# Patient Record
Sex: Male | Born: 1965 | Race: Black or African American | Hispanic: No | Marital: Married | Smoking: Former smoker
Health system: Southern US, Community
[De-identification: ages and names within clinical notes are randomized; demographics above are authoritative.]

## PROBLEM LIST (undated history)

## (undated) DIAGNOSIS — E119 Type 2 diabetes mellitus without complications: Secondary | ICD-10-CM

## (undated) DIAGNOSIS — I472 Ventricular tachycardia, unspecified: Secondary | ICD-10-CM

## (undated) DIAGNOSIS — I5032 Chronic diastolic (congestive) heart failure: Secondary | ICD-10-CM

## (undated) DIAGNOSIS — E785 Hyperlipidemia, unspecified: Secondary | ICD-10-CM

## (undated) DIAGNOSIS — I428 Other cardiomyopathies: Secondary | ICD-10-CM

## (undated) DIAGNOSIS — G4730 Sleep apnea, unspecified: Secondary | ICD-10-CM

## (undated) DIAGNOSIS — I10 Essential (primary) hypertension: Secondary | ICD-10-CM

## (undated) DIAGNOSIS — K219 Gastro-esophageal reflux disease without esophagitis: Secondary | ICD-10-CM

## (undated) DIAGNOSIS — M1990 Unspecified osteoarthritis, unspecified site: Secondary | ICD-10-CM

## (undated) HISTORY — PX: OTHER SURGICAL HISTORY: SHX169

## (undated) HISTORY — DX: Other cardiomyopathies: I42.8

## (undated) HISTORY — DX: Ventricular tachycardia, unspecified: I47.20

## (undated) HISTORY — DX: Ventricular tachycardia: I47.2

## (undated) HISTORY — PX: CARDIAC CATHETERIZATION: SHX172

## (undated) HISTORY — PX: INSERT / REPLACE / REMOVE PACEMAKER: SUR710

## (undated) HISTORY — DX: Chronic diastolic (congestive) heart failure: I50.32

---

## 1999-01-28 ENCOUNTER — Emergency Department (HOSPITAL_COMMUNITY): Admission: EM | Admit: 1999-01-28 | Discharge: 1999-01-28 | Payer: Self-pay | Admitting: *Deleted

## 2000-05-29 ENCOUNTER — Emergency Department (HOSPITAL_COMMUNITY): Admission: EM | Admit: 2000-05-29 | Discharge: 2000-05-29 | Payer: Self-pay | Admitting: Internal Medicine

## 2000-05-29 ENCOUNTER — Encounter: Payer: Self-pay | Admitting: Internal Medicine

## 2001-06-27 ENCOUNTER — Encounter: Payer: Self-pay | Admitting: Internal Medicine

## 2001-06-27 ENCOUNTER — Ambulatory Visit (HOSPITAL_COMMUNITY): Admission: RE | Admit: 2001-06-27 | Discharge: 2001-06-27 | Payer: Self-pay | Admitting: Internal Medicine

## 2001-10-11 ENCOUNTER — Inpatient Hospital Stay (HOSPITAL_COMMUNITY): Admission: EM | Admit: 2001-10-11 | Discharge: 2001-10-14 | Payer: Self-pay | Admitting: Emergency Medicine

## 2001-10-11 ENCOUNTER — Encounter: Payer: Self-pay | Admitting: Cardiovascular Disease

## 2001-10-11 ENCOUNTER — Encounter: Payer: Self-pay | Admitting: Emergency Medicine

## 2003-12-25 ENCOUNTER — Emergency Department (HOSPITAL_COMMUNITY): Admission: EM | Admit: 2003-12-25 | Discharge: 2003-12-25 | Payer: Self-pay | Admitting: Emergency Medicine

## 2004-08-11 ENCOUNTER — Encounter: Admission: RE | Admit: 2004-08-11 | Discharge: 2004-08-11 | Payer: Self-pay | Admitting: Internal Medicine

## 2004-08-29 ENCOUNTER — Inpatient Hospital Stay (HOSPITAL_COMMUNITY): Admission: EM | Admit: 2004-08-29 | Discharge: 2004-08-30 | Payer: Self-pay | Admitting: Emergency Medicine

## 2005-08-02 ENCOUNTER — Emergency Department (HOSPITAL_COMMUNITY): Admission: EM | Admit: 2005-08-02 | Discharge: 2005-08-02 | Payer: Self-pay | Admitting: Emergency Medicine

## 2006-05-18 ENCOUNTER — Emergency Department (HOSPITAL_COMMUNITY): Admission: EM | Admit: 2006-05-18 | Discharge: 2006-05-18 | Payer: Self-pay | Admitting: Emergency Medicine

## 2008-07-12 ENCOUNTER — Inpatient Hospital Stay (HOSPITAL_COMMUNITY): Admission: EM | Admit: 2008-07-12 | Discharge: 2008-07-13 | Payer: Self-pay | Admitting: Emergency Medicine

## 2009-04-04 ENCOUNTER — Emergency Department (HOSPITAL_COMMUNITY): Admission: EM | Admit: 2009-04-04 | Discharge: 2009-04-04 | Payer: Self-pay | Admitting: Emergency Medicine

## 2009-06-08 IMAGING — CT CT HEAD W/O CM
1 of 2 series · 15 of 30 positions shown, 19 images · non-contrast
Comparison: 08/11/2004

CLINICAL DATA: Right frontal headache.  History of hypertension.

CT HEAD WITHOUT CONTRAST
TECHNIQUE: Contiguous axial images were obtained from the base of
the skull through the vertex without contrast.

[Series 2: headseq 4.8 h45s · axial · 0.43mm/px · z∈[-119,+7]mm · 15 of 30 slices shown, 19 images]
[im 2/30  brain]
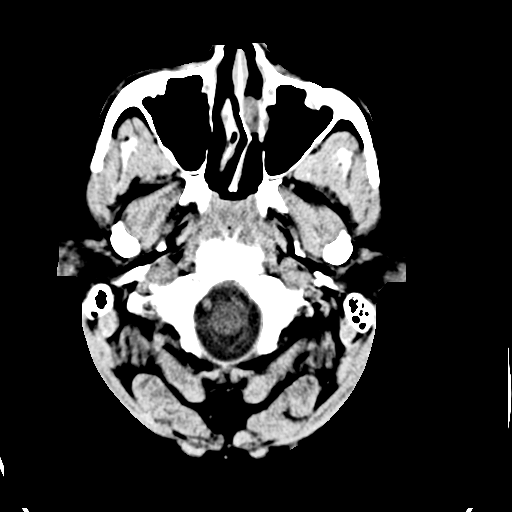
[im 2/30  bone]
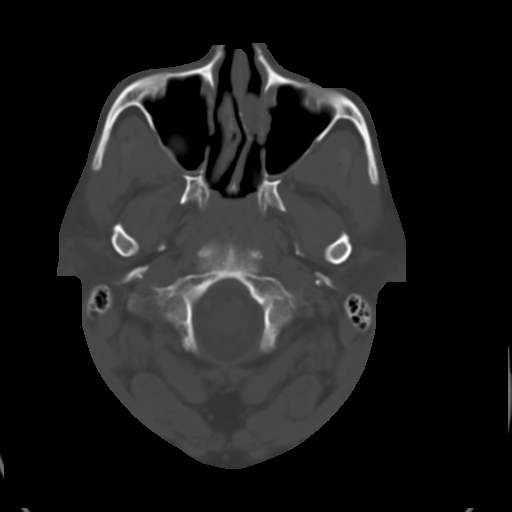
[im 4/30  brain]
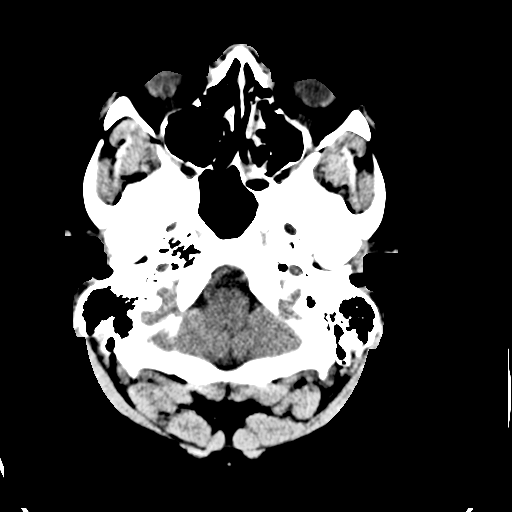
[im 5/30  brain]
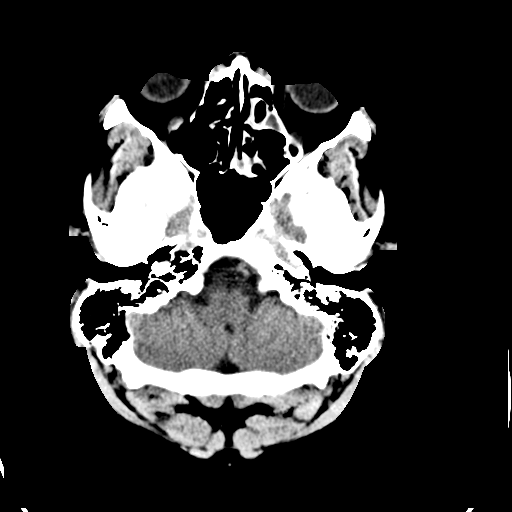
[im 8/30  brain]
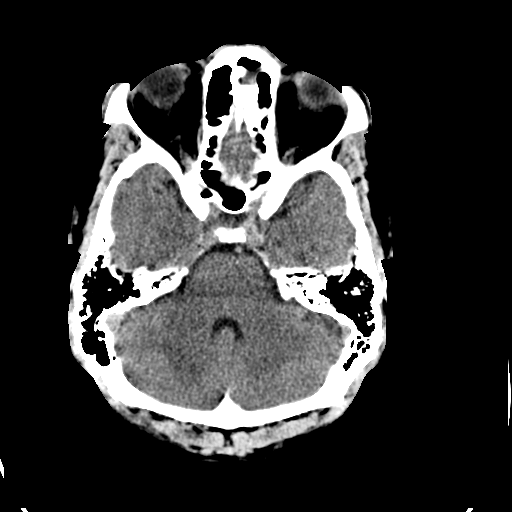
[im 9/30  brain]
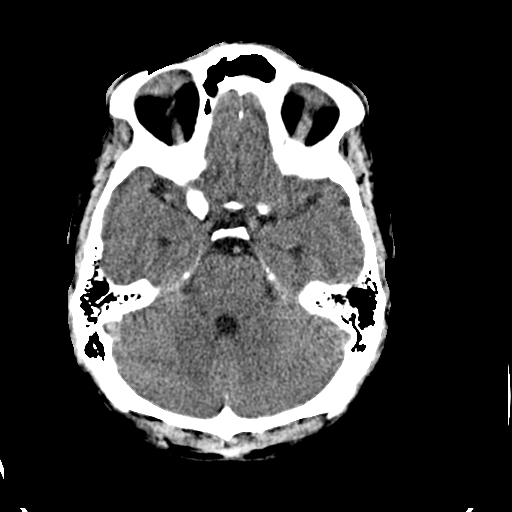
[im 9/30  bone]
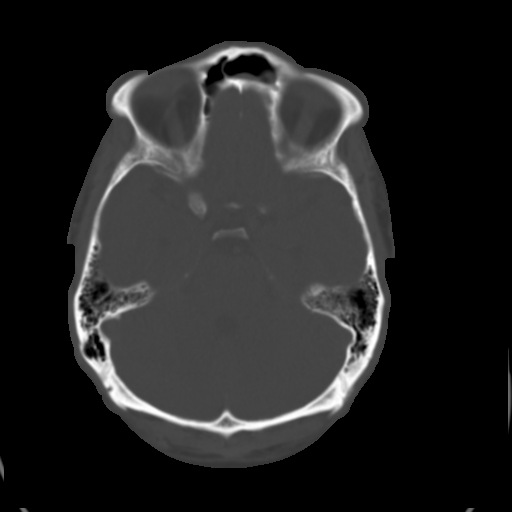
[im 11/30  brain]
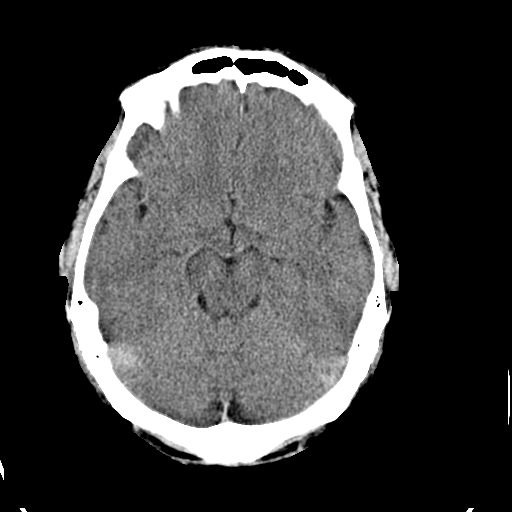
[im 13/30  brain]
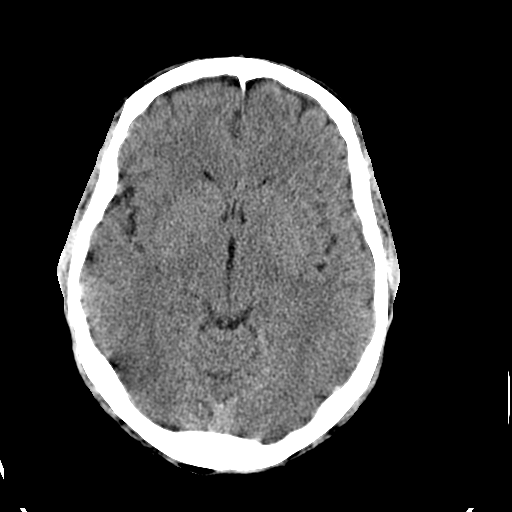
[im 15/30  brain]
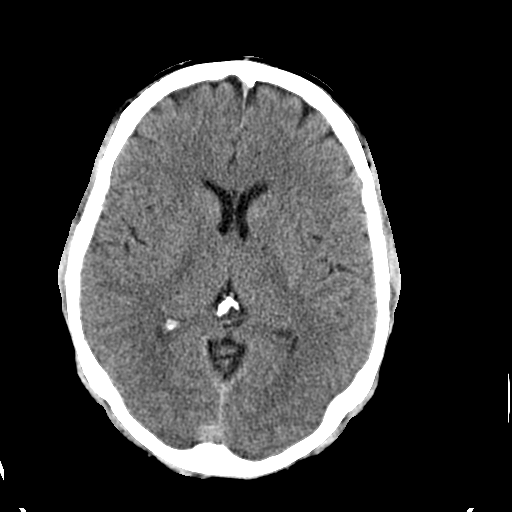
[im 17/30  brain]
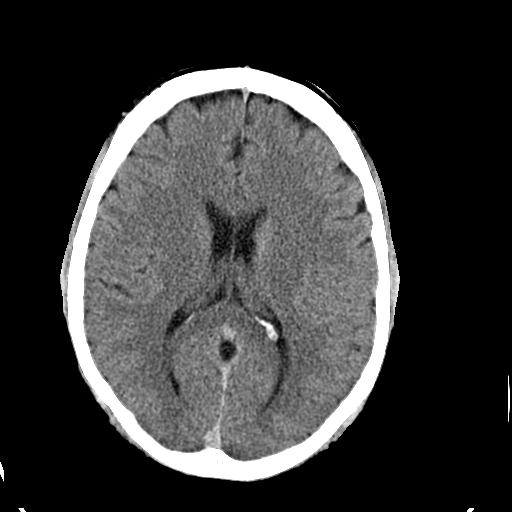
[im 17/30  bone]
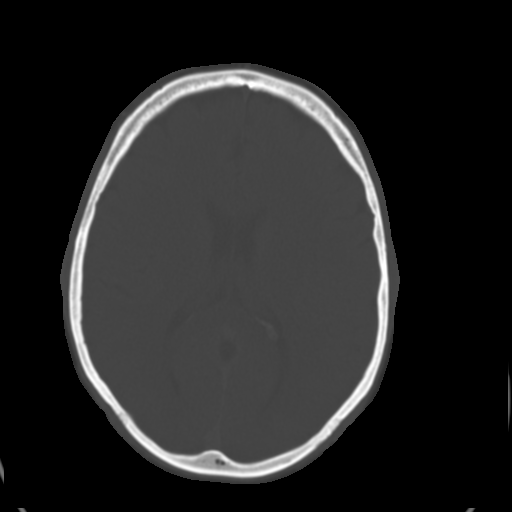
[im 19/30  brain]
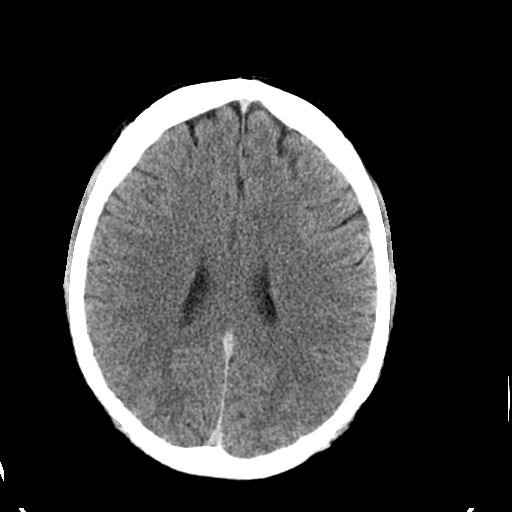
[im 21/30  brain]
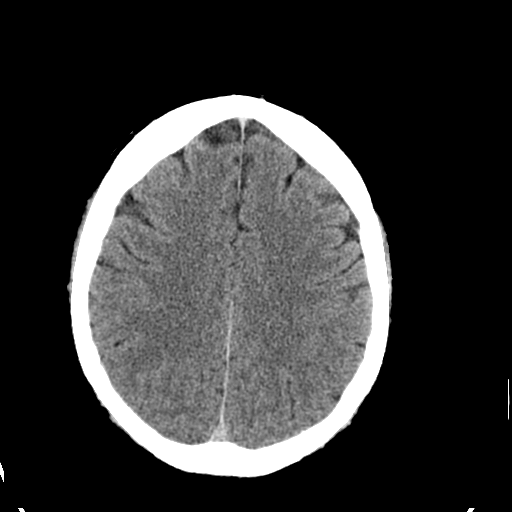
[im 22/30  brain]
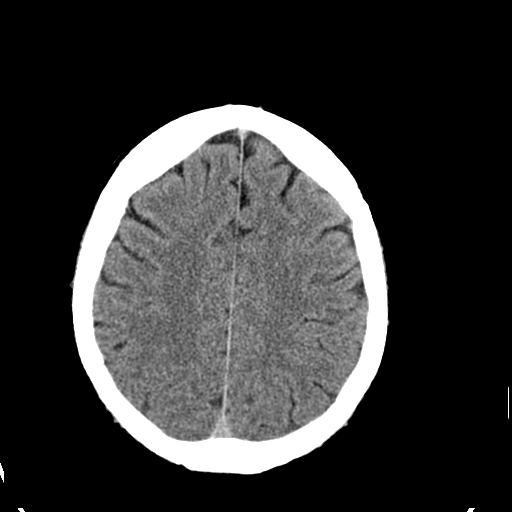
[im 25/30  brain]
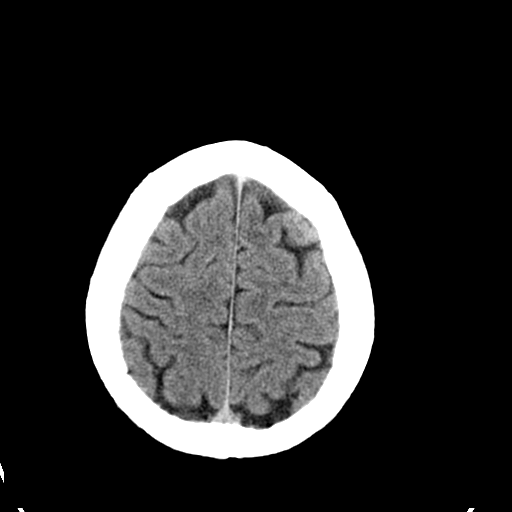
[im 25/30  bone]
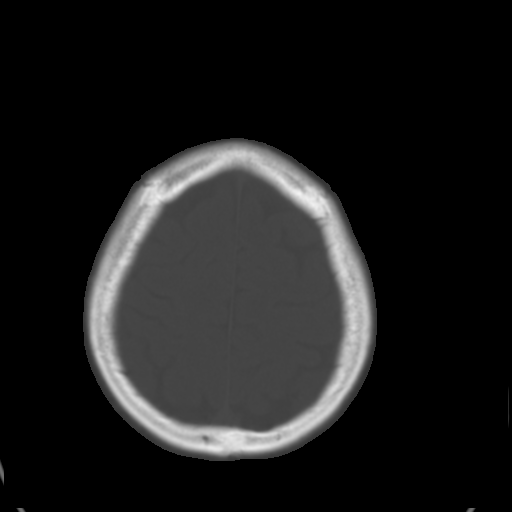
[im 26/30  brain]
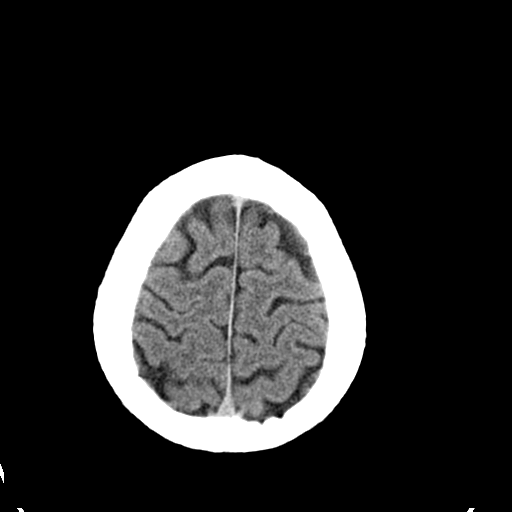
[im 28/30  brain]
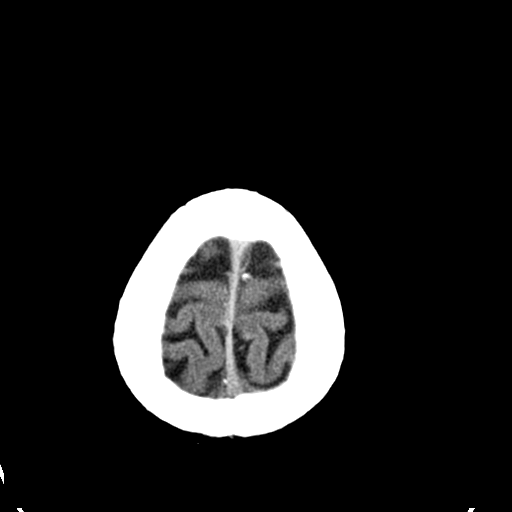

[15 of 30 positions shown; findings below may reference images not displayed]

FINDINGS: The brain stem, cerebellum, cerebral peduncles, thalami,
basal ganglia, basilar cisterns, and ventricular system appear
unremarkable.  No intracranial hemorrhage, mass lesion, or acute
CVA is identified.

There is polypoid mucoperiosteal thickening in the right maxillary
sinus.  Mild chronic left maxillary sinusitis is noted.  Mild
mucosal thickening in the nasal cavity is noted and there may be a
left nasal polyp.  There is opacification of several ethmoid air
cells on the left side compatible with chronic ethmoid sinusitis.
There is minimal left sphenoid chronic sinusitis.  The mastoid air
cells appear unremarkable.  There is mild left frontal chronic
sinusitis.
IMPRESSION: 1.  Chronic paranasal sinusitis.  This is relatively similar to the
prior exam.  Possible left nasal polyp.
2.  No acute intracranial findings.

## 2009-06-26 ENCOUNTER — Ambulatory Visit (HOSPITAL_COMMUNITY): Admission: RE | Admit: 2009-06-26 | Discharge: 2009-06-26 | Payer: Self-pay | Admitting: Plastic Surgery

## 2009-08-15 ENCOUNTER — Ambulatory Visit (HOSPITAL_COMMUNITY): Admission: RE | Admit: 2009-08-15 | Discharge: 2009-08-15 | Payer: Self-pay | Admitting: Plastic Surgery

## 2009-10-31 ENCOUNTER — Ambulatory Visit (HOSPITAL_COMMUNITY): Admission: RE | Admit: 2009-10-31 | Discharge: 2009-10-31 | Payer: Self-pay | Admitting: Plastic Surgery

## 2009-12-03 ENCOUNTER — Encounter: Admission: RE | Admit: 2009-12-03 | Discharge: 2009-12-25 | Payer: Self-pay | Admitting: Plastic Surgery

## 2009-12-24 ENCOUNTER — Encounter: Admission: RE | Admit: 2009-12-24 | Discharge: 2010-01-02 | Payer: Self-pay | Admitting: Plastic Surgery

## 2010-03-29 ENCOUNTER — Emergency Department (HOSPITAL_COMMUNITY): Admission: EM | Admit: 2010-03-29 | Discharge: 2010-03-29 | Payer: Self-pay | Admitting: Emergency Medicine

## 2010-07-31 DIAGNOSIS — K219 Gastro-esophageal reflux disease without esophagitis: Secondary | ICD-10-CM | POA: Insufficient documentation

## 2010-08-13 ENCOUNTER — Encounter: Admission: RE | Admit: 2010-08-13 | Discharge: 2010-08-13 | Payer: Self-pay | Admitting: Cardiology

## 2010-08-18 ENCOUNTER — Ambulatory Visit (HOSPITAL_COMMUNITY): Admission: RE | Admit: 2010-08-18 | Discharge: 2010-08-18 | Payer: Self-pay | Admitting: Cardiovascular Disease

## 2010-09-03 DIAGNOSIS — I509 Heart failure, unspecified: Secondary | ICD-10-CM | POA: Insufficient documentation

## 2010-09-08 ENCOUNTER — Ambulatory Visit (HOSPITAL_COMMUNITY): Admission: RE | Admit: 2010-09-08 | Discharge: 2010-09-08 | Payer: Self-pay | Admitting: Cardiovascular Disease

## 2011-01-18 ENCOUNTER — Encounter: Payer: Self-pay | Admitting: Cardiology

## 2011-03-12 LAB — PROTIME-INR: INR: 0.96 (ref 0.00–1.49)

## 2011-03-12 LAB — CBC
HCT: 38.9 % — ABNORMAL LOW (ref 39.0–52.0)
Hemoglobin: 13.7 g/dL (ref 13.0–17.0)
MCHC: 35.2 g/dL (ref 30.0–36.0)
MCV: 88.2 fL (ref 78.0–100.0)
Platelets: 178 10*3/uL (ref 150–400)
RBC: 4.41 MIL/uL (ref 4.22–5.81)
WBC: 9.4 10*3/uL (ref 4.0–10.5)

## 2011-03-12 LAB — BASIC METABOLIC PANEL
Calcium: 8.6 mg/dL (ref 8.4–10.5)
Chloride: 107 mEq/L (ref 96–112)
Creatinine, Ser: 1.24 mg/dL (ref 0.4–1.5)
GFR calc non Af Amer: >60 mL/min (ref >60)
Glucose, Bld: 104 mg/dL — ABNORMAL HIGH (ref 70–99)
Potassium: 3.8 mEq/L (ref 3.5–5.1)
Sodium: 138 mEq/L (ref 135–145)

## 2011-03-12 LAB — SURGICAL PCR SCREEN: Staphylococcus aureus: NEGATIVE

## 2011-04-01 LAB — BASIC METABOLIC PANEL
Calcium: 8.9 mg/dL (ref 8.4–10.5)
Creatinine, Ser: 1.32 mg/dL (ref 0.4–1.5)
Sodium: 139 mEq/L (ref 135–145)

## 2011-04-01 LAB — CBC
Hemoglobin: 15.6 g/dL (ref 13.0–17.0)
MCHC: 34.5 g/dL (ref 30.0–36.0)
Platelets: 200 10*3/uL (ref 150–400)
RBC: 4.89 MIL/uL (ref 4.22–5.81)
WBC: 9.3 10*3/uL (ref 4.0–10.5)

## 2011-05-12 NOTE — Discharge Summary (Signed)
[name], [name]                   ACCOUNT NO.:  [id number]   MEDICAL RECORD NO.:  [id number]          PATIENT TYPE:  INP   LOCATION:  1424                         FACILITY:  [facility]   PHYSICIAN:  [name], M.D.     DATE OF BIRTH:  12/23/1966   DATE OF ADMISSION:  07/12/2008  DATE OF DISCHARGE:  07/13/2008                               DISCHARGE SUMMARY   DISCHARGE DIAGNOSES:  1. Right-sided headache, probably from sinusitis.  2. Known nonischemic cardiomyopathy.  3. Hypertension with suboptimal control.  4. History of normal coronaries, September 2005.   HOSPITAL COURSE:  [name] is a 45 year old African American male  followed by Dr. Tresa Endo with a history of nonischemic cardiomyopathy.  He  had a catheterization September 2005.  He had a remote history of  substance abuse, but his drug screen was negative.  He presented with 3  days of intermittent right-sided headache as well as right facial  headache.  He had some vague, sharp left lateral chest pain as well.  He  denied any shortness of breath.  He was admitted from the ER and started  on IV heparin.  His CK-MBs were slightly elevated, but his troponins  were all negative.  When examined on the morning of the 17th, he  continued to complain of mainly intermittent right-sided throbbing  headache.  He was sent for a CT scan which revealed chronic sinusitis.  Dr. Elsie Lincoln saw the patient on the 17th and feels he can be discharged.  We have arranged for him to have a followup with an ENT physician. and  he will get a followup exercise Myoview in our office as an outpatient.  The patient does admit that he has had previous sinusitis bouts.  He  really has no primary physician.   DISCHARGE MEDICATIONS:  1. Augmentin 500/125 b.i.d. x7 days.  2. Flonase 2 puffs each nostril daily.  3. Norvasc 5 mg daily.  4. Aspirin 81 mg daily.  5. Coreg 25 mg b.i.d.  6. Lanoxin 0.125 mg daily.  7. Lasix 40 mg daily.  8. Diovan 160 mg daily.  9. Prilosec p.r.n.   LABORATORY:  White count 11.8, hemoglobin 15.3, hematocrit 44.5,  platelets 203, D-dimer less than 2.22.  Sodium 143, potassium 3.6, BUN  10, creatinine 1.16.  Liver functions were normal.  CKs peaked at 203  with 14 MBs.  His troponins were negative x3.  BNP was 75.8.  Cholesterol showed a total cholesterol 215, HDL 32, LDL 50.  TSH is  2.49.  Digoxin level less than 0.2.  Drug screen was negative.  CT of  the head showed chronic paranasal sinusitis, no acute intracranial  findings.  Chest x-ray on the 16th shows no active disease.  His EKG  showed sinus rhythm, sinus bradycardia.  At times, his heart rate would  drop down to 40 at night, although he was asymptomatic with this.   PLAN:  The patient is discharged in stable condition.  We will go ahead  and go up on his Diovan, although it may be best to cut  back on his  Coreg to 12.5 b.i.d.  We will set him up for an outpatient Myoview next  week and set up to see Dr. Rodolph Bong for an ENT evaluation.      Abelino Derrick, P.A.    ______________________________  [name], M.D.    Lenard Lance  D:  07/13/2008  T:  07/13/2008  Job:  [id number]   cc:   Rodolph Bong, M.D.  Fax: [phone]

## 2011-05-15 NOTE — Discharge Summary (Signed)
[name], [name]                               ACCOUNT NO.:  [id number]   MEDICAL RECORD NO.:  [id number]                   PATIENT TYPE:  INP   LOCATION:  3742                                 FACILITY:  [facility]   PHYSICIAN:  Darlin Priestly, M.D.             DATE OF BIRTH:  11/28/1966   DATE OF ADMISSION:  08/29/2004  DATE OF DISCHARGE:  08/29/2004                                 DISCHARGE SUMMARY   DISCHARGE DIAGNOSES:  1.  Chest pain, ruled out for myocardial infarction by negative EKG changes,      no acute cardiac point of care markers, and by negative catheterization.  2.  Hypertension.  3.  Left ventricular dysfunction.  4.  Ethanol abuse.  5.  Cocaine abuse.  6.  Hypertension.  7.  Hypercholesterolemia.  8.  History of pneumonia.  9.  Cardiomyopathy with ejection fraction in 2002 by echocardiogram 20%.   DISCHARGE MEDICATIONS:  1.  Lanoxin 0.25 mg daily.  2.  Lasix 40 mg daily.  3.  Nexium 40 mg daily.  4.  Pravachol 20 mg daily.  5.  Coreg 12.5 mg b.i.d.  6.  Avapro 150 mg daily.  7.  Inspra 25 mg daily.  8.  Aspirin 81 mg daily.   HISTORY OF PRESENT ILLNESS/HOSPITAL COURSE:  This is a 45 year old, African-  American gentleman who experienced onset of substernal chest pain and left  arm pain and numbness, and was delivered to the emergency room by EMS.  En  route, EMS gave the patient sublingual nitroglycerin with relative relief of  pain.  At the time of arrival to the emergency room and assessment by  physician, he still had chest pain, 3 on a scale from 0-10.   The patient was taken to the cardiac catheterization laboratory by Dr.  Pearletha Furl. Alanda Amass.  He performed a catheterization which showed  essentially clear coronaries, just 40% small lesion in the LAD.  His EF was  25 to 30%.   His CK was elevated likely secondary to ethanol abuse, and a decision was  made for catheterization and to proceed to keep him for bed rest and then  discharge if the patient  was stable after ambulation, and no complications  with groin, and follow up with Dr. Tresa Endo in the office.   DISCHARGE INSTRUCTIONS:  1.  No driving, no lifting greater than 5 pounds for three days.  2.  No strenuous activity.   DISCHARGE DIET:  A low-fat, low-cholesterol diet.   DISCHARGE FOLLOWUP:  Appointment scheduled with Dr. Tresa Endo on September 14,  at 3:30 p.m.   LABORATORY DATA:  His point of care markers, first set showed CK-MB 3.4,  myoglobin 398, and troponin 0.05.  Second set CK-MB 3.2, myoglobin 231, and  troponin less than 0.05.  Third set CK-MB 3.1, myoglobin 224 and troponin  less than 0.05.  His hemoglobin was 15.9,  hematocrit 36.3, potassium 3.2,  sodium 136, BUN 11, creatinine 1.4.  We gave him extra potassium prior to  catheterization, and post cath.  We will check potassium level as an  outpatient.      Raymon Mutton, P.A.                    Darlin Priestly, M.D.    MK/MEDQ  D:  08/29/2004  T:  08/30/2004  Job:  [id number]   cc:   [facility]
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
## 2011-05-15 NOTE — Discharge Summary (Signed)
[facility]  Patient:    [name], [name] Visit Number: [id number] MRN: [id number]          Service Type: MED Location: 3W [id number] [id number] Attending Physician:  Wilson Singer Dictated by:   Lilly Cove, M.D. Admit Date:  10/11/2001 Discharge Date: 10/14/2001                             Discharge Summary  MEDICATIONS ON DISCHARGE: 1. Aspirin 325 mg q.d. 2. Pravachol 20 mg q.d. 3. Protonix 40 mg q.d. 4. Digoxin 0.25 mg q.d. 5. Coreg 3.125 mg b.i.d. 6. Altace 5 mg b.i.d. 7. Tequin 400 mg q.d. for five days.  CONDITION ON DISCHARGE:  Stable.  FINAL DISCHARGE DIAGNOSES: 1. Pneumonia. 2. Idiopathic cardiomyopathy.  HISTORY OF PRESENT ILLNESS:  This 45 year old man was admitted with chest pain and cough.  Please see initial history and physical examination by Dr. Velna Hatchet.  HOSPITAL COURSE:  Patient was admitted to the telemetry floor and was seen by cardiology who also did echocardiogram.  Echocardiogram showed ejection fraction of less than 20%.  A CT scan of the chest was done to rule out pulmonary embolism and this was negative.  There was a right pleural rub heard and this could be secondary to a pneumonia.  The CT scan did show evidence of pneumonia rather than a PE.  Heparin was discontinued which had been initially started and he was continued with antibiotics.  He improved somewhat with his symptoms of cough and shortness of breath and medications were adjusted to include an ACE inhibitor and also Coreg.  He had a couple of episodes of nonsustained ventricular tachycardia.  He was in stable condition to be discharged home, but he will follow up with cardiology in approximately two weeks time and follow up with myself in one week.  He will finish his course of antibiotics over the next five days. Dictated by:   Lilly Cove, M.D. Attending Physician:  Wilson Singer DD:  10/26/01 TD:  10/27/01 Job: [id number]

## 2011-09-14 DIAGNOSIS — G4733 Obstructive sleep apnea (adult) (pediatric): Secondary | ICD-10-CM | POA: Insufficient documentation

## 2011-09-25 LAB — CARDIAC PANEL(CRET KIN+CKTOT+MB+TROPI)
Relative Index: 5.9 — ABNORMAL HIGH
Troponin I: 0.02
Troponin I: 0.02

## 2011-09-25 LAB — RAPID URINE DRUG SCREEN, HOSP PERFORMED
Amphetamines: NOT DETECTED
Barbiturates: NOT DETECTED
Benzodiazepines: NOT DETECTED
Cocaine: NOT DETECTED
Opiates: NOT DETECTED
Tetrahydrocannabinol: NOT DETECTED

## 2011-09-25 LAB — POCT CARDIAC MARKERS
CKMB, poc: 10.8
Myoglobin, poc: 197
Operator id: 264421
Troponin i, poc: <0.05

## 2011-09-25 LAB — TROPONIN I: Troponin I: 0.03

## 2011-09-25 LAB — CK TOTAL AND CKMB (NOT AT ARMC)
CK, MB: 14.4 — ABNORMAL HIGH
Relative Index: 7.1 — ABNORMAL HIGH
Total CK: 203

## 2011-09-25 LAB — COMPREHENSIVE METABOLIC PANEL
ALT: 36
AST: 34
Albumin: 3.6
Chloride: 108
Creatinine, Ser: 1.16
GFR calc Af Amer: >60
Sodium: 143
Total Bilirubin: 1

## 2011-09-25 LAB — POCT I-STAT, CHEM 8
BUN: 14
Calcium, Ion: 1.07 — ABNORMAL LOW
Chloride: 107
Creatinine, Ser: 1.4
Glucose, Bld: 81
HCT: 45
Hemoglobin: 15.3
Potassium: 3.4 — ABNORMAL LOW
Sodium: 141
TCO2: 23

## 2011-09-25 LAB — CBC
MCHC: 34.5
MCV: 90.1
Platelets: 203
RDW: 13.4
WBC: 11.8 — ABNORMAL HIGH

## 2011-09-25 LAB — PROTIME-INR
INR: 0.9
Prothrombin Time: 12.7

## 2011-09-25 LAB — DIFFERENTIAL
Basophils Absolute: 0.1
Eosinophils Absolute: 0.2
Lymphs Abs: 3.7
Monocytes Absolute: 1
Neutrophils Relative %: 61

## 2011-09-25 LAB — LIPID PANEL
Cholesterol: 215 — ABNORMAL HIGH
HDL: 32 — ABNORMAL LOW
Total CHOL/HDL Ratio: 6.7
Triglycerides: 165 — ABNORMAL HIGH

## 2011-09-25 LAB — APTT: aPTT: 31

## 2011-09-25 LAB — TSH: TSH: 2.495

## 2011-09-25 LAB — DIGOXIN LEVEL: Digoxin Level: <0.2 — ABNORMAL LOW

## 2011-10-21 DIAGNOSIS — Z800 Family history of malignant neoplasm of digestive organs: Secondary | ICD-10-CM | POA: Insufficient documentation

## 2011-11-12 DIAGNOSIS — J329 Chronic sinusitis, unspecified: Secondary | ICD-10-CM | POA: Insufficient documentation

## 2011-11-24 DIAGNOSIS — I10 Essential (primary) hypertension: Secondary | ICD-10-CM

## 2011-11-24 HISTORY — DX: Essential (primary) hypertension: I10

## 2012-10-18 ENCOUNTER — Other Ambulatory Visit (HOSPITAL_COMMUNITY): Payer: Self-pay | Admitting: Orthopaedic Surgery

## 2012-10-19 ENCOUNTER — Encounter (HOSPITAL_COMMUNITY): Payer: Self-pay | Admitting: *Deleted

## 2012-10-20 ENCOUNTER — Encounter (HOSPITAL_COMMUNITY): Payer: Self-pay | Admitting: Pharmacy Technician

## 2012-10-20 NOTE — Progress Notes (Signed)
Refaxed to [facility] request for Cardiac Perioperative Orders to be completed.  This is second request.

## 2012-10-20 NOTE — Progress Notes (Signed)
Medications not listed under home meds as of yet.  Pharmacy unable to contact patient.  Nurse called patient and spoke with patient again over phone and received name of pharmacy- [facility]- corner of [address] and [address].  Patient stated he was not going to take any meds tomorrow( 10/25 ) prior to surgery.  Nurse instructed patient to take Carvedilol ( coreg) in am on 10/25 with sip of water.

## 2012-10-21 ENCOUNTER — Encounter (HOSPITAL_COMMUNITY): Payer: Self-pay | Admitting: Anesthesiology

## 2012-10-21 ENCOUNTER — Encounter (HOSPITAL_COMMUNITY): Payer: Self-pay | Admitting: *Deleted

## 2012-10-21 ENCOUNTER — Ambulatory Visit (HOSPITAL_COMMUNITY)
Admission: RE | Admit: 2012-10-21 | Discharge: 2012-10-21 | Disposition: A | Discharge status: Home / Self Care | Payer: Medicare Other | Source: Ambulatory Visit | Attending: Orthopaedic Surgery | Admitting: Orthopaedic Surgery

## 2012-10-21 ENCOUNTER — Ambulatory Visit (HOSPITAL_COMMUNITY): Payer: Medicare Other

## 2012-10-21 ENCOUNTER — Ambulatory Visit (HOSPITAL_COMMUNITY): Payer: Medicare Other | Admitting: Anesthesiology

## 2012-10-21 ENCOUNTER — Encounter (HOSPITAL_COMMUNITY)
Admission: RE | Disposition: A | Discharge status: Home / Self Care | Payer: Self-pay | Source: Ambulatory Visit | Attending: Orthopaedic Surgery

## 2012-10-21 DIAGNOSIS — E119 Type 2 diabetes mellitus without complications: Secondary | ICD-10-CM | POA: Insufficient documentation

## 2012-10-21 DIAGNOSIS — R2232 Localized swelling, mass and lump, left upper limb: Secondary | ICD-10-CM

## 2012-10-21 DIAGNOSIS — Z79899 Other long term (current) drug therapy: Secondary | ICD-10-CM | POA: Insufficient documentation

## 2012-10-21 DIAGNOSIS — I10 Essential (primary) hypertension: Secondary | ICD-10-CM | POA: Insufficient documentation

## 2012-10-21 DIAGNOSIS — K219 Gastro-esophageal reflux disease without esophagitis: Secondary | ICD-10-CM | POA: Insufficient documentation

## 2012-10-21 DIAGNOSIS — Z7982 Long term (current) use of aspirin: Secondary | ICD-10-CM | POA: Insufficient documentation

## 2012-10-21 DIAGNOSIS — G4730 Sleep apnea, unspecified: Secondary | ICD-10-CM | POA: Insufficient documentation

## 2012-10-21 DIAGNOSIS — R229 Localized swelling, mass and lump, unspecified: Secondary | ICD-10-CM | POA: Insufficient documentation

## 2012-10-21 DIAGNOSIS — Z950 Presence of cardiac pacemaker: Secondary | ICD-10-CM | POA: Insufficient documentation

## 2012-10-21 HISTORY — DX: Unspecified osteoarthritis, unspecified site: M19.90

## 2012-10-21 HISTORY — DX: Type 2 diabetes mellitus without complications: E11.9

## 2012-10-21 HISTORY — DX: Essential (primary) hypertension: I10

## 2012-10-21 HISTORY — DX: Sleep apnea, unspecified: G47.30

## 2012-10-21 HISTORY — DX: Gastro-esophageal reflux disease without esophagitis: K21.9

## 2012-10-21 HISTORY — PX: MASS EXCISION: SHX2000

## 2012-10-21 LAB — SURGICAL PCR SCREEN
MRSA, PCR: NEGATIVE
Staphylococcus aureus: NEGATIVE

## 2012-10-21 LAB — GLUCOSE, CAPILLARY
Glucose-Capillary: 105 mg/dL — ABNORMAL HIGH (ref 70–99)
Glucose-Capillary: 90 mg/dL (ref 70–99)

## 2012-10-21 LAB — BASIC METABOLIC PANEL
CO2: 26 mEq/L (ref 19–32)
Calcium: 8.9 mg/dL (ref 8.4–10.5)
GFR calc non Af Amer: >90 mL/min (ref >90)
Potassium: 3.8 mEq/L (ref 3.5–5.1)
Sodium: 140 mEq/L (ref 135–145)

## 2012-10-21 LAB — CBC
MCH: 29.7 pg (ref 26.0–34.0)
Platelets: 188 10*3/uL (ref 150–400)
RBC: 4.34 MIL/uL (ref 4.22–5.81)

## 2012-10-21 SURGERY — EXCISION MASS
Anesthesia: General | Site: Finger | Laterality: Left

## 2012-10-21 MED ORDER — LACTATED RINGERS IV SOLN
INTRAVENOUS | Status: DC | PRN
  Administered 2012-10-21: 15:00:00 via INTRAVENOUS

## 2012-10-21 MED ORDER — FENTANYL CITRATE 0.05 MG/ML IJ SOLN
INTRAMUSCULAR | Status: DC | PRN
  Administered 2012-10-21 (×2): 50 ug via INTRAVENOUS

## 2012-10-21 MED ORDER — PROPOFOL 10 MG/ML IV BOLUS
INTRAVENOUS | Status: DC | PRN
  Administered 2012-10-21: 200 mg via INTRAVENOUS
  Administered 2012-10-21 (×2): 100 mg via INTRAVENOUS

## 2012-10-21 MED ORDER — LIDOCAINE HCL (CARDIAC) 20 MG/ML IV SOLN
INTRAVENOUS | Status: DC | PRN
  Administered 2012-10-21: 100 mg via INTRAVENOUS

## 2012-10-21 MED ORDER — PROMETHAZINE HCL 25 MG/ML IJ SOLN: 6.2500 mg | INTRAMUSCULAR | Status: DC | PRN

## 2012-10-21 MED ORDER — SUCCINYLCHOLINE CHLORIDE 20 MG/ML IJ SOLN
INTRAMUSCULAR | Status: DC | PRN
  Administered 2012-10-21: 100 mg via INTRAVENOUS

## 2012-10-21 MED ORDER — ROCURONIUM BROMIDE 100 MG/10ML IV SOLN
INTRAVENOUS | Status: DC | PRN
  Administered 2012-10-21: 4 mg via INTRAVENOUS

## 2012-10-21 MED ORDER — BUPIVACAINE HCL (PF) 0.25 % IJ SOLN
INTRAMUSCULAR | Status: DC | PRN
  Administered 2012-10-21: 9 mL

## 2012-10-21 MED ORDER — ACETAMINOPHEN 10 MG/ML IV SOLN
INTRAVENOUS | Status: DC | PRN
  Administered 2012-10-21: 1000 mg via INTRAVENOUS

## 2012-10-21 MED ORDER — LACTATED RINGERS IV SOLN
INTRAVENOUS | Status: DC
  Administered 2012-10-21: 1000 mL via INTRAVENOUS

## 2012-10-21 MED ORDER — CEFAZOLIN SODIUM-DEXTROSE 2-3 GM-% IV SOLR: 2.0000 g | INTRAVENOUS | Status: DC

## 2012-10-21 MED ORDER — CEFAZOLIN SODIUM-DEXTROSE 2-3 GM-% IV SOLR
INTRAVENOUS | Status: DC | PRN
  Administered 2012-10-21: 2 g via INTRAVENOUS

## 2012-10-21 MED ORDER — BUPIVACAINE HCL (PF) 0.25 % IJ SOLN
INTRAMUSCULAR | Status: AC
  Filled 2012-10-21: qty 30

## 2012-10-21 MED ORDER — 0.9 % SODIUM CHLORIDE (POUR BTL) OPTIME
TOPICAL | Status: DC | PRN
  Administered 2012-10-21: 1000 mL

## 2012-10-21 MED ORDER — CEFAZOLIN SODIUM-DEXTROSE 2-3 GM-% IV SOLR
INTRAVENOUS | Status: AC
  Filled 2012-10-21: qty 50

## 2012-10-21 MED ORDER — HYDROMORPHONE HCL PF 1 MG/ML IJ SOLN
INTRAMUSCULAR | Status: AC
  Filled 2012-10-21: qty 1

## 2012-10-21 MED ORDER — ACETAMINOPHEN 10 MG/ML IV SOLN: 1000.0000 mg | Freq: Once | INTRAVENOUS | Status: DC | PRN

## 2012-10-21 MED ORDER — HYDROMORPHONE HCL PF 1 MG/ML IJ SOLN
0.2500 mg | INTRAMUSCULAR | Status: DC | PRN
  Administered 2012-10-21: 0.5 mg via INTRAVENOUS

## 2012-10-21 MED ORDER — HYDROCODONE-ACETAMINOPHEN 5-325 MG PO TABS: 1.0000 | ORAL_TABLET | ORAL | Status: DC | PRN

## 2012-10-21 MED ORDER — MUPIROCIN 2 % EX OINT
TOPICAL_OINTMENT | Freq: Two times a day (BID) | CUTANEOUS | Status: DC
  Administered 2012-10-21: 1 via NASAL
  Filled 2012-10-21: qty 22

## 2012-10-21 MED ORDER — OXYCODONE HCL 5 MG PO TABS: 5.0000 mg | ORAL_TABLET | Freq: Once | ORAL | Status: DC | PRN

## 2012-10-21 MED ORDER — MIDAZOLAM HCL 5 MG/5ML IJ SOLN
INTRAMUSCULAR | Status: DC | PRN
  Administered 2012-10-21: 2 mg via INTRAVENOUS

## 2012-10-21 MED ORDER — OXYCODONE HCL 5 MG/5ML PO SOLN
5.0000 mg | Freq: Once | ORAL | Status: DC | PRN
  Filled 2012-10-21: qty 5

## 2012-10-21 MED ORDER — MEPERIDINE HCL 50 MG/ML IJ SOLN: 6.2500 mg | INTRAMUSCULAR | Status: DC | PRN

## 2012-10-21 MED ORDER — ACETAMINOPHEN 10 MG/ML IV SOLN
INTRAVENOUS | Status: AC
  Filled 2012-10-21: qty 100

## 2012-10-21 SURGICAL SUPPLY — 31 items
BANDAGE CONFORM 2  STR LF (GAUZE/BANDAGES/DRESSINGS) ×1 IMPLANT
BANDAGE ELASTIC 6 VELCRO ST LF (GAUZE/BANDAGES/DRESSINGS) ×2 IMPLANT
BANDAGE ESMARK 6X9 LF (GAUZE/BANDAGES/DRESSINGS) ×1 IMPLANT
BNDG CMPR 9X6 STRL LF SNTH (GAUZE/BANDAGES/DRESSINGS) ×1
BNDG COHESIVE 1X5 TAN STRL LF (GAUZE/BANDAGES/DRESSINGS) ×1 IMPLANT
BNDG ESMARK 6X9 LF (GAUZE/BANDAGES/DRESSINGS) ×2
CLOTH BEACON ORANGE TIMEOUT ST (SAFETY) ×2 IMPLANT
COVER SURGICAL LIGHT HANDLE (MISCELLANEOUS) ×2 IMPLANT
CUFF TOURN SGL QUICK 34 (TOURNIQUET CUFF) ×2
CUFF TRNQT CYL 34X4X40X1 (TOURNIQUET CUFF) ×1 IMPLANT
DRAPE POUCH INSTRU U-SHP 10X18 (DRAPES) ×2 IMPLANT
DRAPE U-SHAPE 47X51 STRL (DRAPES) ×2 IMPLANT
DRSG ADAPTIC 3X8 NADH LF (GAUZE/BANDAGES/DRESSINGS) ×2 IMPLANT
DURAPREP 26ML APPLICATOR (WOUND CARE) ×2 IMPLANT
GAUZE XEROFORM 1X8 LF (GAUZE/BANDAGES/DRESSINGS) ×2 IMPLANT
GAUZE XEROFORM 4X4 STRL (GAUZE/BANDAGES/DRESSINGS) ×2 IMPLANT
GLOVE ORTHO TXT STRL SZ7.5 (GLOVE) ×2 IMPLANT
GOWN PREVENTION PLUS LG XLONG (DISPOSABLE) ×4 IMPLANT
GOWN STRL REIN XL XLG (GOWN DISPOSABLE) ×2 IMPLANT
KIT BASIN OR (CUSTOM PROCEDURE TRAY) ×2 IMPLANT
MANIFOLD NEPTUNE II (INSTRUMENTS) ×2 IMPLANT
NS IRRIG 1000ML POUR BTL (IV SOLUTION) ×2 IMPLANT
PACK LOWER EXTREMITY WL (CUSTOM PROCEDURE TRAY) ×2 IMPLANT
POSITIONER SURGICAL ARM (MISCELLANEOUS) ×2 IMPLANT
SUCTION FRAZIER 12FR DISP (SUCTIONS) ×2 IMPLANT
SUT ETHILON 4 0 PS 2 18 (SUTURE) ×4 IMPLANT
SUT PROLENE 4 0 RB 1 (SUTURE) ×2
SUT PROLENE 4-0 RB1 .5 CRCL 36 (SUTURE) ×1 IMPLANT
TOWEL BLUE STERILE X RAY DET (MISCELLANEOUS) ×6 IMPLANT
TOWEL OR 17X26 10 PK STRL BLUE (TOWEL DISPOSABLE) ×4 IMPLANT
WATER STERILE IRR 1500ML POUR (IV SOLUTION) ×2 IMPLANT

## 2012-10-21 NOTE — Anesthesia Postprocedure Evaluation (Signed)
Anesthesia Post Note  Patient: [name]  Procedure(s) Performed: Procedure(s) (LRB): EXCISION MASS (Left)  Anesthesia type: General  Patient location: PACU  Post pain: Pain level controlled  Post assessment: Post-op Vital signs reviewed  Last Vitals: BP 142/92  Pulse 64  Temp 36.4 C  Resp 18  Ht 5\' 8"  (1.727 m)  Wt 220 lb 4 oz (99.905 kg)  BMI 33.49 kg/m2  SpO2 98%  Post vital signs: Reviewed  Level of consciousness: sedated  Complications: No apparent anesthesia complications

## 2012-10-21 NOTE — Brief Op Note (Signed)
10/21/2012  3:57 PM  PATIENT:  [name]  46 y.o. male  PRE-OPERATIVE DIAGNOSIS:  Left ring finger mass  POST-OPERATIVE DIAGNOSIS:  Left ring finger mass  PROCEDURE:  Procedure(s) (LRB) with comments: EXCISION MASS (Left) - Excision Left Ring Finger Mass  SURGEON:  Surgeon(s) and Role:    * Kathryne Hitch, MD - Primary  PHYSICIAN ASSISTANT:   ASSISTANTS: none   ANESTHESIA:   local and general  EBL:     BLOOD ADMINISTERED:none  DRAINS: none   LOCAL MEDICATIONS USED:  MARCAINE     SPECIMEN:  Excision  DISPOSITION OF SPECIMEN:  PATHOLOGY  COUNTS:  YES  TOURNIQUET:   Total Tourniquet Time Documented: Upper Arm (Left) - 15 minutes  DICTATION: .Other Dictation: Dictation Number [id number]  PLAN OF CARE: Discharge to home after PACU  PATIENT DISPOSITION:  PACU - hemodynamically stable.   Delay start of Pharmacological VTE agent (>24hrs) due to surgical blood loss or risk of bleeding: not applicable

## 2012-10-21 NOTE — Anesthesia Preprocedure Evaluation (Addendum)
Anesthesia Evaluation  Patient identified by MRN, date of birth, ID band Patient awake    Reviewed: Allergy & Precautions, H&P , NPO status , Patient's Chart, lab work & pertinent test results, reviewed documented beta blocker date and time   Airway Mallampati: II TM Distance: >3 FB Neck ROM: Full    Dental  (+) Teeth Intact and Dental Advisory Given   Pulmonary shortness of breath and with exertion, sleep apnea and Continuous Positive Airway Pressure Ventilation , former smoker,  breath sounds clear to auscultation  Pulmonary exam normal       Cardiovascular hypertension, Pt. on home beta blockers and Pt. on medications +CHF + dysrhythmias + pacemaker Rhythm:Regular Rate:Normal  LVSF ~30%.    Neuro/Psych negative neurological ROS  negative psych ROS   GI/Hepatic Neg liver ROS, GERD-  Medicated,  Endo/Other  diabetes, Type 2, Oral Hypoglycemic Agents  Renal/GU negative Renal ROS     Musculoskeletal negative musculoskeletal ROS (+)   Abdominal (+) + obese,   Peds  Hematology negative hematology ROS (+)   Anesthesia Other Findings   Reproductive/Obstetrics                         Anesthesia Physical Anesthesia Plan  ASA: III  Anesthesia Plan: General   Post-op Pain Management:    Induction: Intravenous  Airway Management Planned: LMA  Additional Equipment:   Intra-op Plan:   Post-operative Plan: Extubation in OR  Informed Consent: I have reviewed the patients History and Physical, chart, labs and discussed the procedure including the risks, benefits and alternatives for the proposed anesthesia with the patient or authorized representative who has indicated his/her understanding and acceptance.   Dental advisory given  Plan Discussed with: CRNA  Anesthesia Plan Comments:         Anesthesia Quick Evaluation

## 2012-10-21 NOTE — Transfer of Care (Signed)
Immediate Anesthesia Transfer of Care Note  Patient: [name]  Procedure(s) Performed: Procedure(s) (LRB) with comments: EXCISION MASS (Left) - Excision Left Ring Finger Mass  Patient Location: PACU  Anesthesia Type: General  Level of Consciousness: awake and alert   Airway & Oxygen Therapy: Patient Spontanous Breathing and Patient connected to face mask oxygen  Post-op Assessment: Report given to PACU RN and Post -op Vital signs reviewed and stable,   Post vital signs: Reviewed and stable  Complications: No apparent anesthesia complications

## 2012-10-21 NOTE — H&P (Signed)
[name] is an 46 y.o. male.   Chief Complaint: painful, palpable mass left ring finger HPI:   46 yo male with a mass on the volar aspect of his left ring ringer that has been there a long time.  It is only uncomfortable when he wears his wedding band, which has become difficult to do due to the mass.  His wife wishes for him to have the mass excised so that he can wear his wedding ring.  Past Medical History  Diagnosis Date  . Pacemaker   . CHF (congestive heart failure)   . Hypertension   . Dysrhythmia   . Shortness of breath     with exertion  . Sleep apnea     cpap setting at 11   . Diabetes mellitus without complication     po meds  . GERD (gastroesophageal reflux disease)   . Arthritis     Past Surgical History  Procedure Date  . Cardiac catheterization   . Right hand surgery      pins placed in hand due to accident     History reviewed. No pertinent family history. Social History:  reports that he has quit smoking. He has never used smokeless tobacco. He reports that he drinks alcohol. He reports that he does not use illicit drugs.  Allergies:  Allergies  Allergen Reactions  . Nitroglycerin Hives    Sub lingual broke him out in hives 2009    Medications Prior to Admission  Medication Sig Dispense Refill  . carvedilol (COREG) 25 MG tablet Take 50 mg by mouth 2 (two) times daily with a meal.      . furosemide (LASIX) 40 MG tablet Take 40 mg by mouth daily.      . metFORMIN (GLUCOPHAGE-XR) 500 MG 24 hr tablet Take 500 mg by mouth 2 (two) times daily.      Marland Kitchen spironolactone (ALDACTONE) 25 MG tablet Take 25 mg by mouth daily.      . valsartan (DIOVAN) 160 MG tablet Take 160 mg by mouth 2 (two) times daily.      Marland Kitchen aspirin 325 MG tablet Take 325 mg by mouth daily.      Marland Kitchen atorvastatin (LIPITOR) 40 MG tablet Take 40 mg by mouth every evening.      . digoxin (LANOXIN) 0.25 MG tablet Take 0.25 mg by mouth daily.        Results for orders placed during the hospital  encounter of 10/21/12 (from the past 48 hour(s))  GLUCOSE, CAPILLARY     Status: Abnormal   Collection Time   10/21/12  2:15 PM      Component Value Range Comment   Glucose-Capillary 105 (*) 70 - 99 mg/dL    No results found.  Review of Systems  All other systems reviewed and are negative.    Blood pressure 139/88, pulse 83, temperature 97.4 F (36.3 C), resp. rate 20, height 5\' 8"  (1.727 m), weight 99.905 kg (220 lb 4 oz), SpO2 96.00%. Physical Exam  Constitutional: He is oriented to person, place, and time. He appears well-developed and well-nourished.  HENT:  Head: Normocephalic and atraumatic.  Eyes: EOM are normal. Pupils are equal, round, and reactive to light.  Neck: Normal range of motion. Neck supple.  Cardiovascular: Normal rate and regular rhythm.   Respiratory: Breath sounds normal.  GI: Soft. Bowel sounds are normal.  Musculoskeletal:       Hands: Neurological: He is alert and oriented to person, place, and time.  Skin: Skin is warm and dry.  Psychiatric: He has a normal mood and affect.     Assessment/Plan Left ring finger volar mass 1) to the OR as an outpatient for excision of a left ring rfinger mass.  BLACKMAN,CHRISTOPHER Y 10/21/2012, 2:26 PM

## 2012-10-22 NOTE — Op Note (Signed)
NAMEWARDEN, BUFFA                   ACCOUNT NO.:  [id number]  MEDICAL RECORD NO.:  [id number]  LOCATION:  [facility]                         FACILITY:  [facility]  PHYSICIAN:  Vanita Panda. Magnus Ivan, M.D.DATE OF BIRTH:  [birth year]  DATE OF PROCEDURE:  10/21/2012 DATE OF DISCHARGE:  10/21/2012                              OPERATIVE REPORT   PREOPERATIVE DIAGNOSIS:  Left ring finger volar mass.  POSTOPERATIVE DIAGNOSIS:  Left ring finger volar mass.  PROCEDURE:  Excision of left ring finger volar mass.  FINDINGS:  Firm large mass on the volar surface of left ring finger, sent to Pathology for permanent identification.  SURGEON:  Vanita Panda. Magnus Ivan, M.D.  ANESTHESIA: 1. General. 2. Local digital block with 0.25% plain Marcaine.  TOURNIQUET TIME:  Less than 15 minutes.  BLOOD LOSS:  Minimal.  COMPLICATIONS:  None.  INDICATIONS:  [name] is a 46 year old gentleman with expanding mass at his left ring finger on the volar aspect.  This has made it difficult for him for wearing rings.  He states it has been slowly growing for many years now.  He said the only reason he still wanted to have something done that his ring finger is just not fitting there well and his wife does not like him going without his wedding band on his ring finger.  He would like to have this excised.  The risks and benefits of surgery explained to him in detail and he does wish to proceed with surgery.  PROCEDURE DESCRIPTION:  After informed consent was obtained, appropriate left hand was marked.  He was brought to the operative room and placed supine on the operative table with left hand on the arm table. Nonsterile tourniquet was placed on his upper arm.  General anesthesia was then obtained.  His left hand was prepped and draped with DuraPrep and sterile drapes.  Time-out was called and he was identified as correct patient and correct left hand.  I then used Esmarch to wrap up the hand and the tourniquet  was inflated to 250 mm of pressure.  I then made an incision directly over the mass on the volar aspect of the finger of the proximal phalanx and dissected down and found an capsulated mass, which I was able to tease away from the neurovascular bundle, however, I did feel like it may have been contiguous with the nerve, it was definitely hard to tell.  Once I got this excised, I let the tourniquet down.  The fingers pinked nicely.  I irrigated the tissue with normal saline solution and reapproximated the skin with interrupted 4-0 nylon suture.  We did pass the nodule off when it was found to be solid, and sent to Pathology for permanent identification.  I then placed Xeroform and well- padded sterile dressing on his finger.  He was awakened, extubated, and taken to the recovery room in a stable condition.  All final counts were correct.  There were no complications noted.     Vanita Panda. Magnus Ivan, M.D.     CYB/MEDQ  D:  10/21/2012  T:  10/22/2012  Job:  [id number]

## 2012-10-24 ENCOUNTER — Encounter (HOSPITAL_COMMUNITY): Payer: Self-pay | Admitting: Orthopaedic Surgery

## 2013-02-28 ENCOUNTER — Emergency Department (HOSPITAL_COMMUNITY)
Admission: EM | Admit: 2013-02-28 | Discharge: 2013-02-28 | Disposition: A | Discharge status: Home / Self Care | Payer: Medicare Other | Attending: Emergency Medicine | Admitting: Emergency Medicine

## 2013-02-28 ENCOUNTER — Emergency Department (HOSPITAL_COMMUNITY): Payer: Medicare Other

## 2013-02-28 ENCOUNTER — Encounter (HOSPITAL_COMMUNITY): Payer: Self-pay | Admitting: *Deleted

## 2013-02-28 DIAGNOSIS — R05 Cough: Secondary | ICD-10-CM

## 2013-02-28 DIAGNOSIS — Z8719 Personal history of other diseases of the digestive system: Secondary | ICD-10-CM | POA: Insufficient documentation

## 2013-02-28 DIAGNOSIS — Z8739 Personal history of other diseases of the musculoskeletal system and connective tissue: Secondary | ICD-10-CM | POA: Insufficient documentation

## 2013-02-28 DIAGNOSIS — J3489 Other specified disorders of nose and nasal sinuses: Secondary | ICD-10-CM | POA: Insufficient documentation

## 2013-02-28 DIAGNOSIS — Z7982 Long term (current) use of aspirin: Secondary | ICD-10-CM | POA: Insufficient documentation

## 2013-02-28 DIAGNOSIS — R059 Cough, unspecified: Secondary | ICD-10-CM | POA: Insufficient documentation

## 2013-02-28 DIAGNOSIS — B9789 Other viral agents as the cause of diseases classified elsewhere: Secondary | ICD-10-CM | POA: Insufficient documentation

## 2013-02-28 DIAGNOSIS — G4730 Sleep apnea, unspecified: Secondary | ICD-10-CM | POA: Insufficient documentation

## 2013-02-28 DIAGNOSIS — H9209 Otalgia, unspecified ear: Secondary | ICD-10-CM | POA: Insufficient documentation

## 2013-02-28 DIAGNOSIS — I509 Heart failure, unspecified: Secondary | ICD-10-CM | POA: Insufficient documentation

## 2013-02-28 DIAGNOSIS — E119 Type 2 diabetes mellitus without complications: Secondary | ICD-10-CM | POA: Insufficient documentation

## 2013-02-28 DIAGNOSIS — R0602 Shortness of breath: Secondary | ICD-10-CM | POA: Insufficient documentation

## 2013-02-28 DIAGNOSIS — Z8679 Personal history of other diseases of the circulatory system: Secondary | ICD-10-CM | POA: Insufficient documentation

## 2013-02-28 DIAGNOSIS — I10 Essential (primary) hypertension: Secondary | ICD-10-CM | POA: Insufficient documentation

## 2013-02-28 DIAGNOSIS — Z79899 Other long term (current) drug therapy: Secondary | ICD-10-CM | POA: Insufficient documentation

## 2013-02-28 DIAGNOSIS — Z87891 Personal history of nicotine dependence: Secondary | ICD-10-CM | POA: Insufficient documentation

## 2013-02-28 DIAGNOSIS — Z950 Presence of cardiac pacemaker: Secondary | ICD-10-CM | POA: Insufficient documentation

## 2013-02-28 LAB — CBC
HCT: 41.7 % (ref 39.0–52.0)
Hemoglobin: 14.6 g/dL (ref 13.0–17.0)
MCH: 30.6 pg (ref 26.0–34.0)
MCHC: 35 g/dL (ref 30.0–36.0)
MCV: 87.4 fL (ref 78.0–100.0)
RBC: 4.77 MIL/uL (ref 4.22–5.81)

## 2013-02-28 LAB — BASIC METABOLIC PANEL
BUN: 13 mg/dL (ref 6–23)
CO2: 25 mEq/L (ref 19–32)
GFR calc Af Amer: >90 mL/min (ref >90)
Glucose, Bld: 106 mg/dL — ABNORMAL HIGH (ref 70–99)

## 2013-02-28 MED ORDER — HYDROCOD POLST-CHLORPHEN POLST 10-8 MG/5ML PO LQCR: 5.0000 mL | Freq: Two times a day (BID) | ORAL | Status: DC | PRN

## 2013-02-28 NOTE — Progress Notes (Signed)
Pt noted without a pcp listed Cm spoke with pt who confirms he was seeing [place]cp in [place] but has an appointment on March 7 th or 9th with Dr Roseanne Reno at [facility]

## 2013-02-28 NOTE — ED Notes (Signed)
D/c instructions reviewed and all questions and concerns addressed. NAD, VSS, pain controlled.

## 2013-02-28 NOTE — ED Notes (Signed)
Pt states that he has been having a productive cough that had a greenish color to it that has now turned to more of a white color for about one week now. Pt is expericing SHOB even at rest. The [facility] is keeping him up at night. Also having left arm pain and numbness that will not go away with OTC meds.

## 2013-02-28 NOTE — ED Provider Notes (Signed)
History     CSN: [id number]  Arrival date & time 02/28/13  0907   First MD Initiated Contact with Patient 02/28/13 0913      Chief Complaint  Patient presents with  . Cough  . Arm Pain  . Otalgia    (Consider location/radiation/quality/duration/timing/severity/associated sxs/prior treatment) HPI Pt presents with c/o productive cough, nasal congestion and sensation of fullness in ears.  He denies chest pain.  Shortness of breath is associated with coughing.  Denies shortness of breath keeping him up at night as written in triage note- he mentions nasal congestion being the cause.  No fever/chills.  No leg swelling.  There are no other associated systemic symptoms, there are no other alleviating or modifying factors.   Past Medical History  Diagnosis Date  . Pacemaker   . CHF (congestive heart failure)   . Hypertension   . Dysrhythmia   . Shortness of breath     with exertion  . Sleep apnea     cpap setting at 9   . Diabetes mellitus without complication     po meds  . GERD (gastroesophageal reflux disease)   . Arthritis     Past Surgical History  Procedure Laterality Date  . Cardiac catheterization    . Right hand surgery       pins placed in hand due to accident   . Mass excision  10/21/2012    Procedure: EXCISION MASS;  Surgeon: Kathryne Hitch, MD;  Location: [facility];  Service: Orthopedics;  Laterality: Left;  Excision Left Ring Finger Mass    History reviewed. No pertinent family history.  History  Substance Use Topics  . Smoking status: Former Games developer  . Smokeless tobacco: Never Used  . Alcohol Use: Yes     Comment: 2x per week       Review of Systems ROS reviewed and all otherwise negative except for mentioned in HPI  Allergies  Nitroglycerin  Home Medications   Current Outpatient Rx  Name  Route  Sig  Dispense  Refill  . aspirin 325 MG tablet   Oral   Take 325 mg by mouth daily.         . carvedilol (COREG) 25 MG tablet   Oral  Take 25 mg by mouth 2 (two) times daily with a meal.          . cholecalciferol (VITAMIN D) 1000 UNITS tablet   Oral   Take 1,000 Units by mouth daily.         . digoxin (LANOXIN) 0.25 MG tablet   Oral   Take 0.25 mg by mouth daily.         . furosemide (LASIX) 40 MG tablet   Oral   Take 40 mg by mouth daily.         . metFORMIN (GLUCOPHAGE) 500 MG tablet   Oral   Take 500 mg by mouth 2 (two) times daily with a meal.         . spironolactone (ALDACTONE) 25 MG tablet   Oral   Take 25 mg by mouth 2 (two) times daily.          . valsartan (DIOVAN) 160 MG tablet   Oral   Take 160 mg by mouth 2 (two) times daily.         . chlorpheniramine-HYDROcodone (TUSSIONEX PENNKINETIC ER) 10-8 MG/5ML LQCR   Oral   Take 5 mLs by mouth every 12 (twelve) hours as needed.   140 mL  0     BP 145/93  Pulse 56  Temp(Src) 98.7 F (37.1 C) (Oral)  Resp 16  Ht 5\' 8"  (1.727 m)  Wt 220 lb (99.791 kg)  BMI 33.46 kg/m2  SpO2 98% Vitals reviewed Physical Exam Physical Examination: General appearance - alert, well appearing, and in no distress Mental status - alert, oriented to person, place, and time Eyes - no scleral icterus, no conjunctival injection Mouth - mucous membranes moist, pharynx normal without lesions Chest - clear to auscultation, no wheezes, rales or rhonchi, symmetric air entry Heart - normal rate, regular rhythm, normal S1, S2, no murmurs, rubs, clicks or gallops Abdomen - soft, nontender, nondistended, no masses or organomegaly Extremities - peripheral pulses normal, no pedal edema, no clubbing or cyanosis Skin - normal coloration and turgor, no rashes  ED Course  Procedures (including critical care time)  Labs Reviewed  PRO B NATRIURETIC PEPTIDE - Abnormal; Notable for the following:    Pro B Natriuretic peptide (BNP) 317.6 (*)    All other components within normal limits  BASIC METABOLIC PANEL - Abnormal; Notable for the following:    Glucose, Bld  106 (*)    GFR calc non Af Amer 83 (*)    All other components within normal limits  TROPONIN I  CBC   Dg Chest 2 View  02/28/2013  *RADIOLOGY REPORT*  Clinical Data: Cough and congestion  CHEST - 2 VIEW  Comparison: 10/21/2012  Findings: The cardiac shadow is stable.  A pacing device is again noted.  The atrial lead is in a slightly different position although this may be related the patient positioning the lungs are clear.  No acute bony abnormality is seen.  IMPRESSION: No acute abnormality noted.   Original Report Authenticated By: Alcide Clever, M.D.      1. Cough   2. Viral syndrome       MDM  Pt presents with c/o cough, nasal congestion.  Pt has hx of CHF and pacer- CXR shows no fluid overload or other acute abnormality.   Will discharge with cough medication.  Discharged with strict return precautions.  Pt agreeable with plan.        Ethelda Chick, MD 02/28/13 1131

## 2013-03-29 ENCOUNTER — Ambulatory Visit (HOSPITAL_COMMUNITY)
Admission: RE | Admit: 2013-03-29 | Discharge: 2013-03-29 | Disposition: A | Discharge status: Home / Self Care | Payer: Medicare Other | Source: Ambulatory Visit | Attending: Internal Medicine | Admitting: Internal Medicine

## 2013-03-29 ENCOUNTER — Other Ambulatory Visit (HOSPITAL_COMMUNITY): Payer: Self-pay | Admitting: Internal Medicine

## 2013-03-29 DIAGNOSIS — Z8701 Personal history of pneumonia (recurrent): Secondary | ICD-10-CM | POA: Insufficient documentation

## 2013-03-29 DIAGNOSIS — Z95810 Presence of automatic (implantable) cardiac defibrillator: Secondary | ICD-10-CM | POA: Insufficient documentation

## 2013-03-29 DIAGNOSIS — F17200 Nicotine dependence, unspecified, uncomplicated: Secondary | ICD-10-CM | POA: Insufficient documentation

## 2013-03-29 DIAGNOSIS — I10 Essential (primary) hypertension: Secondary | ICD-10-CM | POA: Insufficient documentation

## 2013-03-29 DIAGNOSIS — R059 Cough, unspecified: Secondary | ICD-10-CM | POA: Insufficient documentation

## 2013-03-29 DIAGNOSIS — R0602 Shortness of breath: Secondary | ICD-10-CM | POA: Insufficient documentation

## 2013-03-29 DIAGNOSIS — R05 Cough: Secondary | ICD-10-CM | POA: Insufficient documentation

## 2013-05-28 ENCOUNTER — Encounter: Payer: Self-pay | Admitting: *Deleted

## 2013-05-29 ENCOUNTER — Encounter: Payer: Self-pay | Admitting: Cardiovascular Disease

## 2013-05-29 ENCOUNTER — Ambulatory Visit: Payer: Medicare Other | Admitting: Cardiovascular Disease

## 2013-05-31 ENCOUNTER — Ambulatory Visit: Payer: Medicare Other | Admitting: Cardiovascular Disease

## 2013-06-05 ENCOUNTER — Ambulatory Visit (INDEPENDENT_AMBULATORY_CARE_PROVIDER_SITE_OTHER): Payer: Medicare Other | Admitting: Cardiovascular Disease

## 2013-06-05 ENCOUNTER — Encounter: Payer: Self-pay | Admitting: Cardiovascular Disease

## 2013-06-05 VITALS — BP 118/84 | HR 58 | Ht 68.0 in | Wt 206.4 lb

## 2013-06-05 DIAGNOSIS — Z950 Presence of cardiac pacemaker: Secondary | ICD-10-CM

## 2013-06-05 DIAGNOSIS — E785 Hyperlipidemia, unspecified: Secondary | ICD-10-CM

## 2013-06-05 DIAGNOSIS — Z79899 Other long term (current) drug therapy: Secondary | ICD-10-CM

## 2013-06-05 DIAGNOSIS — E119 Type 2 diabetes mellitus without complications: Secondary | ICD-10-CM

## 2013-06-05 DIAGNOSIS — Z9989 Dependence on other enabling machines and devices: Secondary | ICD-10-CM | POA: Insufficient documentation

## 2013-06-05 DIAGNOSIS — I4729 Other ventricular tachycardia: Secondary | ICD-10-CM

## 2013-06-05 DIAGNOSIS — R5383 Other fatigue: Secondary | ICD-10-CM

## 2013-06-05 DIAGNOSIS — Z95810 Presence of automatic (implantable) cardiac defibrillator: Secondary | ICD-10-CM

## 2013-06-05 DIAGNOSIS — I119 Hypertensive heart disease without heart failure: Secondary | ICD-10-CM

## 2013-06-05 DIAGNOSIS — G4733 Obstructive sleep apnea (adult) (pediatric): Secondary | ICD-10-CM

## 2013-06-05 DIAGNOSIS — E66811 Obesity, class 1: Secondary | ICD-10-CM

## 2013-06-05 DIAGNOSIS — I429 Cardiomyopathy, unspecified: Secondary | ICD-10-CM

## 2013-06-05 DIAGNOSIS — R5381 Other malaise: Secondary | ICD-10-CM

## 2013-06-05 DIAGNOSIS — I10 Essential (primary) hypertension: Secondary | ICD-10-CM | POA: Insufficient documentation

## 2013-06-05 DIAGNOSIS — I428 Other cardiomyopathies: Secondary | ICD-10-CM

## 2013-06-05 DIAGNOSIS — E669 Obesity, unspecified: Secondary | ICD-10-CM

## 2013-06-05 DIAGNOSIS — I472 Ventricular tachycardia, unspecified: Secondary | ICD-10-CM

## 2013-06-05 NOTE — Patient Instructions (Addendum)
Your physician recommends that you return for lab work in 1-2 weeks. You will need to be fasting for this lab work, meaning nothing to eat/drink after midnight prior.  CBC, CMET, TSH, A1C, LIPID, DIGOXIN  Your physician recommends that you schedule a follow-up appointment in: 6 months.  Will continue to follow up remotely.

## 2013-06-05 NOTE — Progress Notes (Signed)
Patient ID: [name], male   DOB: 1966-10-17, 47 y.o.   MRN: [id number]     HPI: [name], is a 47 y.o. male who presents to the office today for assessment. I last saw the approximate 7 months ago. [name] is a 47 year old African American gentleman who has a history of a nonischemic myopathy. In 2003 his ejection fraction was 15-20% at which time he had insertion of an ICD. In June 2013 pacemaker interrogation was done and he was found to have one episode of VT in excess of 200 beats per minute he was placed back into sinus rhythm which occurred in February 2013. Patient also has a history of diabetes mellitus, hypertension. Also has obstructive sleep apnea and uses his CPAP therapy most of the time but not 100% of the time. Para the past 7 months, he feels that he is doing pretty well per he does have some chronic pain syndromes. So notes some left shoulder arm discomfort which seems to be more musculoskeletal. He denies significant shortness of breath. He has not had laboratory done since October 2013. He denies PND or orthopnea.  Past Medical History  Diagnosis Date  . Pacemaker   . CHF (congestive heart failure)   . Dysrhythmia   . Shortness of breath     with exertion  . Sleep apnea     cpap setting at 11   . Diabetes mellitus without complication     po meds  . GERD (gastroesophageal reflux disease)   . Arthritis   . Hypertension 11/24/11    ECHO-EF52%    Past Surgical History  Procedure Laterality Date  . Cardiac catheterization    . Right hand surgery       pins placed in hand due to accident   . Mass excision  10/21/2012    Procedure: EXCISION MASS;  Surgeon: Kathryne Hitch, MD;  Location: [facility];  Service: Orthopedics;  Laterality: Left;  Excision Left Ring Finger Mass  . Insert / replace / remove pacemaker      Allergies  Allergen Reactions  . Nitroglycerin Hives    Sub lingual broke him out in hives 2009    Current Outpatient Prescriptions  Medication Sig  Dispense Refill  . aspirin 325 MG tablet Take 325 mg by mouth daily.      Marland Kitchen atorvastatin (LIPITOR) 40 MG tablet Take 40 mg by mouth daily.      . carvedilol (COREG) 25 MG tablet Take 25 mg by mouth 2 (two) times daily with a meal.       . cholecalciferol (VITAMIN D) 1000 UNITS tablet Take 1,000 Units by mouth daily.      . digoxin (LANOXIN) 0.25 MG tablet Take 0.25 mg by mouth daily.      . furosemide (LASIX) 40 MG tablet Take 40 mg by mouth daily.      Marland Kitchen HYDROcodone-acetaminophen (NORCO/VICODIN) 5-325 MG per tablet Take 1 tablet by mouth every 6 (six) hours.      . metFORMIN (GLUCOPHAGE) 500 MG tablet Take 500 mg by mouth 2 (two) times daily with a meal.      . potassium chloride (MICRO-K) 10 MEQ CR capsule Take 10 mEq by mouth daily.      Marland Kitchen spironolactone (ALDACTONE) 25 MG tablet Take 25 mg by mouth 2 (two) times daily.       Marland Kitchen UNABLE TO FIND CPAP THERAPHY      . valsartan (DIOVAN) 160 MG tablet Take 160 mg by mouth  2 (two) times daily.       No current facility-administered medications for this visit.    He is married has 7 children 2 grandchildren. He states he needs to get a new primary care physician. In the past he had seen Dr. Eula Listen. He recent tobacco use.  ROS is negative for fever chills or night sweats. He is unaware of any arrhythmia. He is unaware of any defibrillator discharge. He does walk. He denies residual daytime sleepiness. When he uses CPAP he does not snore. He denies nausea vomiting or diarrhea. There is no bleeding. Other system review is negative.  PE BP 118/84  Pulse 58  Ht 5\' 8"  (1.727 m)  Wt 206 lb 6.4 oz (93.622 kg)  BMI 31.39 kg/m2  General: Alert, oriented, no distress.  HEENT: Normocephalic, atraumatic. Pupils round and reactive; sclera anicteric;no lid lag,  Nose without nasal septal hypertrophy Mouth/Parynx benign; Mallinpatti scale 3 Neck: Thick neck; No JVD, no carotid briuts Lungs: clear to ausculatation and percussion; no wheezing or  rales Heart: RRR, s1 s2 normal 1/6 systolic murmur. No S3. Abdomen: soft, nontender; no hepatosplenomehaly, BS+; abdominal aorta nontender and not dilated by palpation. Pulses 2+ Extremities: no clubbing cyanosis or edema, Homan's sign negative  Neurologic: grossly nonfocal  ECG: Sinus bradycardia 58 beats per minute with nonspecific T wave abnormality.  [name]'s pacemaker defibrillator was interrogated today. He has a Lumax 540 generator Biotronik device. At her status is good battery voltage 3.11 and longevity of 66%. Atrial impedance was 567 P-wave sensing 1.18 threshold 0.7 V at 0.4 ms. RV impedance was 431 R wave sensing 6.0 RV threshold 0.9 and 0.4 ms. His histogram were normal. He has not had any defibrillator discharge. His current mode is DDD lower rate 50 upper rate 150 to use a paced 7% of the time the case 0% per the VT1 detection rate 176 VF 200  LABS:  BMET    Component Value Date/Time   NA 142 02/28/2013 0950   K 4.1 02/28/2013 0950   CL 103 02/28/2013 0950   CO2 25 02/28/2013 0950   GLUCOSE 106* 02/28/2013 0950   BUN 13 02/28/2013 0950   CREATININE 1.04 02/28/2013 0950   CALCIUM 8.9 02/28/2013 0950   GFRNONAA 83* 02/28/2013 0950   GFRAA >90 02/28/2013 0950     Hepatic Function Panel     Component Value Date/Time   PROT 6.3 07/13/2008 0425   ALBUMIN 3.6 07/13/2008 0425   AST 34 07/13/2008 0425   ALT 36 07/13/2008 0425   ALKPHOS 51 07/13/2008 0425   BILITOT 1.0 07/13/2008 0425     CBC    Component Value Date/Time   WBC 9.7 02/28/2013 0950   RBC 4.77 02/28/2013 0950   HGB 14.6 02/28/2013 0950   HCT 41.7 02/28/2013 0950   PLT 258 02/28/2013 0950   MCV 87.4 02/28/2013 0950   MCH 30.6 02/28/2013 0950   MCHC 35.0 02/28/2013 0950   RDW 13.2 02/28/2013 0950   LYMPHSABS 3.7 07/12/2008 2000   MONOABS 1.0 07/12/2008 2000   EOSABS 0.2 07/12/2008 2000   BASOSABS 0.1 07/12/2008 2000     BNP    Component Value Date/Time   PROBNP 317.6* 02/28/2013 0950    Lipid Panel     Component Value Date/Time    CHOL  Value: 215        ATP III CLASSIFICATION:  <200     mg/dL   Desirable  952-841  mg/dL   Borderline High  >=  240    mg/dL   High* 04/05/8118 1478   TRIG 165* 07/13/2008 0425   HDL 32* 07/13/2008 0425   CHOLHDL 6.7 07/13/2008 0425   VLDL 33 07/13/2008 0425   LDLCALC  Value: 150        Total Cholesterol/HDL:CHD Risk Coronary Heart Disease Risk Table                     Men   Women  1/2 Average Risk   3.4   3.3* 07/13/2008 0425     RADIOLOGY: No results found.    ASSESSMENT AND PLAN: [name] is currently without angina symptoms. He has a history of a nonischemic myopathy and is status post ICD implantation. Since I last saw him in December 2013 he has not had any further episodes of ventricular tachycardia the his last echo Doppler study in November 2012 did show an ejection fraction of 35-40%. His been some time since laboratory was checked. I will obtain a complete set of laboratory the fasting state. In 3 months he will get a referral for one-year followup ICD assessment.  I did discuss with him the absolute importance of using his CPAP 100% at the time we also discussed exercise, weight loss. His blood pressure is well-controlled. I will see him in 6 months for cardiology followup evaluation.     Lennette Bihari, MD, [facility]  06/05/2013 4:38 PM

## 2013-06-05 NOTE — Progress Notes (Signed)
In office ICD interrogation. Normal device function. No changes made this session. 

## 2013-06-23 ENCOUNTER — Other Ambulatory Visit: Payer: Self-pay | Admitting: *Deleted

## 2013-06-23 MED ORDER — FUROSEMIDE 40 MG PO TABS: 40.0000 mg | ORAL_TABLET | Freq: Every day | ORAL | Status: DC

## 2013-07-26 ENCOUNTER — Other Ambulatory Visit: Payer: Self-pay | Admitting: *Deleted

## 2013-07-26 MED ORDER — METFORMIN HCL 500 MG PO TABS: 500.0000 mg | ORAL_TABLET | Freq: Two times a day (BID) | ORAL | Status: DC

## 2013-07-26 NOTE — Telephone Encounter (Signed)
Rx was sent to pharmacy electronically. 

## 2013-09-01 ENCOUNTER — Encounter: Payer: Medicare Other | Admitting: Cardiovascular Disease

## 2013-09-27 ENCOUNTER — Ambulatory Visit (INDEPENDENT_AMBULATORY_CARE_PROVIDER_SITE_OTHER): Payer: Medicare Other | Admitting: Cardiology

## 2013-09-27 ENCOUNTER — Encounter: Payer: Self-pay | Admitting: Cardiology

## 2013-09-27 ENCOUNTER — Other Ambulatory Visit: Payer: Self-pay | Admitting: Cardiology

## 2013-09-27 VITALS — BP 120/80 | HR 51 | Ht 68.0 in | Wt 196.0 lb

## 2013-09-27 DIAGNOSIS — R079 Chest pain, unspecified: Secondary | ICD-10-CM

## 2013-09-27 DIAGNOSIS — Z95810 Presence of automatic (implantable) cardiac defibrillator: Secondary | ICD-10-CM

## 2013-09-27 DIAGNOSIS — I428 Other cardiomyopathies: Secondary | ICD-10-CM

## 2013-09-27 DIAGNOSIS — R05 Cough: Secondary | ICD-10-CM

## 2013-09-27 DIAGNOSIS — I2510 Atherosclerotic heart disease of native coronary artery without angina pectoris: Secondary | ICD-10-CM

## 2013-09-27 DIAGNOSIS — R059 Cough, unspecified: Secondary | ICD-10-CM

## 2013-09-27 MED ORDER — NAPROXEN 375 MG PO TABS: 375.0000 mg | ORAL_TABLET | Freq: Two times a day (BID) | ORAL | Status: DC

## 2013-09-27 MED ORDER — OMEPRAZOLE 20 MG PO CPDR: 20.0000 mg | DELAYED_RELEASE_CAPSULE | Freq: Every day | ORAL | Status: DC

## 2013-09-27 NOTE — Assessment & Plan Note (Signed)
No CHF on exam 

## 2013-09-27 NOTE — Patient Instructions (Signed)
Stop Aldactone. Start Naprosyn twice a day with food. Take Omeprazole with this. See Dr Tresa Endo in 2 weeks.

## 2013-09-27 NOTE — Assessment & Plan Note (Addendum)
B/P controlled 

## 2013-09-27 NOTE — Assessment & Plan Note (Signed)
Symptoms sounds more like muscular pain or possibly mild pericarditis

## 2013-09-27 NOTE — Assessment & Plan Note (Signed)
B/P controlled 

## 2013-09-27 NOTE — Progress Notes (Signed)
09/27/2013 [name]   [birth year]  [id number]  Primary Physicia Altamese Cullison, MD Primary Cardiologist: Dr Tresa Endo  HPI:  The patient is a 46 year old African American gentleman who has a history of nonischemic cardiomyopathy.He was cathed in 1998 and 2005 showing minor CAD. In 2003 ejection fraction was 15-20% at which time he had insertion of an ICD. He is followed by Dr Tresa Endo. He did have one episode of VT that he was paced out of in June 2013. Dr Tresa Endo saw him in Dec 2013. His last echo was Nov 2012 and his EF was 35-40%.          He is seen in the office today with complaints of chest pain. He describes Lt sided sharp chest pain that last seconds and is associated with movement. When he lays flat he feels the chest pain is worse. He also complains of a chronic cough which the pt feels is secondary to Aldactone. He denies orthopnea or PND. He has not had any recent febrile illness.     Current Outpatient Prescriptions  Medication Sig Dispense Refill  . aspirin 325 MG tablet Take 325 mg by mouth daily.      Marland Kitchen atorvastatin (LIPITOR) 40 MG tablet Take 40 mg by mouth daily.      . carvedilol (COREG) 25 MG tablet Take 25 mg by mouth 2 (two) times daily with a meal.       . cholecalciferol (VITAMIN D) 1000 UNITS tablet Take 1,000 Units by mouth daily.      . digoxin (LANOXIN) 0.25 MG tablet Take 0.25 mg by mouth daily.      . furosemide (LASIX) 40 MG tablet Take 1 tablet (40 mg total) by mouth daily.  30 tablet  6  . potassium chloride (MICRO-K) 10 MEQ CR capsule Take 10 mEq by mouth daily.      Marland Kitchen UNABLE TO FIND CPAP THERAPHY      . valsartan (DIOVAN) 160 MG tablet Take 160 mg by mouth 2 (two) times daily.      Marland Kitchen HYDROcodone-acetaminophen (NORCO/VICODIN) 5-325 MG per tablet Take 1 tablet by mouth every 6 (six) hours.      . metFORMIN (GLUCOPHAGE) 500 MG tablet Take 1 tablet (500 mg total) by mouth 2 (two) times daily with a meal.  60 tablet  11  . naproxen (NAPROSYN) 375 MG tablet Take 1 tablet  (375 mg total) by mouth 2 (two) times daily with a meal.  20 tablet  0  . omeprazole (PRILOSEC) 20 MG capsule Take 1 capsule (20 mg total) by mouth daily.  20 capsule  0   No current facility-administered medications for this visit.    Allergies  Allergen Reactions  . Nitroglycerin Hives    Sub lingual broke him out in hives 2009    History   Social History  . Marital Status: Married    Spouse Name: N/A    Number of Children: N/A  . Years of Education: N/A   Occupational History  . Not on file.   Social History Main Topics  . Smoking status: Former Smoker    Types: Cigarettes    Quit date: 05/28/2013  . Smokeless tobacco: Never Used     Comment: still smokes a cigar once and a while  . Alcohol Use: Yes     Comment: 2x per week   . Drug Use: No  . Sexual Activity: Not on file   Other Topics Concern  . Not on file   Social  History Narrative  . No narrative on file     Review of Systems: General: negative for chills, fever, night sweats or weight changes.  Cardiovascular: negative for dyspnea on exertion, edema, orthopnea, palpitations, paroxysmal nocturnal dyspnea or shortness of breath Dermatological: negative for rash Respiratory: negative for wheezing, no hemoptysis Urologic: negative for hematuria Abdominal: negative for nausea, vomiting, diarrhea, bright red blood per rectum, melena, or hematemesis Neurologic: negative for visual changes, syncope, or dizziness All other systems reviewed and are otherwise negative except as noted above.    Blood pressure 120/80, pulse 51, height 5\' 8"  (1.727 m), weight 196 lb (88.905 kg).  General appearance: alert, cooperative and no distress Lungs: clear to auscultation bilaterally Heart: regular rate and rhythm and no rub noted Extremities: no edema  EKG NSR with NSST changes  ASSESSMENT AND PLAN:   Chest pain with low risk of acute coronary syndrome Symptoms sounds more like muscular pain or possibly mild  pericarditis  Cough This hs been a chronic problem. The pt feels it is from Aldactone.  Non-ischemic cardiomyopathy- last EF 35-40% by echo Nov 2012 No CHF on exam  HTN (hypertension) B/P controlled  CAD (coronary artery disease)- minor CAD at cath 2005, low risk Nuc 7/09 .B/P controlled  ICD (implantable cardioverter-defibrillator) in place Biotronic ICD checked December 2013    PLAN  I ordered an echo and started him on a NSAID and a PPI. He probably has M/S chest pain from chronic coughing but will check an echo to r/o pericarditis. I did tell him he could stop his Aldactone. He will follow up with Dr Tresa Endo in 2 weeks.  [facility] 09/27/2013 4:57 PM

## 2013-09-27 NOTE — Assessment & Plan Note (Signed)
Biotronic ICD checked December 2013

## 2013-09-27 NOTE — Assessment & Plan Note (Signed)
This hs been a chronic problem. The pt feels it is from Aldactone.

## 2013-09-28 NOTE — Telephone Encounter (Signed)
Rx was sent to pharmacy electronically. 

## 2013-10-05 ENCOUNTER — Encounter: Payer: Medicare Other | Admitting: Cardiovascular Disease

## 2013-10-09 ENCOUNTER — Ambulatory Visit (INDEPENDENT_AMBULATORY_CARE_PROVIDER_SITE_OTHER): Payer: Medicare Other | Admitting: Cardiovascular Disease

## 2013-10-09 ENCOUNTER — Encounter: Payer: Self-pay | Admitting: Cardiovascular Disease

## 2013-10-09 VITALS — BP 120/62 | HR 50 | Resp 16 | Ht 68.0 in | Wt 199.0 lb

## 2013-10-09 DIAGNOSIS — R079 Chest pain, unspecified: Secondary | ICD-10-CM

## 2013-10-09 DIAGNOSIS — I472 Ventricular tachycardia: Secondary | ICD-10-CM

## 2013-10-09 DIAGNOSIS — Z95810 Presence of automatic (implantable) cardiac defibrillator: Secondary | ICD-10-CM

## 2013-10-09 DIAGNOSIS — I428 Other cardiomyopathies: Secondary | ICD-10-CM

## 2013-10-09 LAB — ICD DEVICE OBSERVATION

## 2013-10-09 NOTE — Patient Instructions (Signed)
Your home monitor will continue doing checks every 3 months as long as your monitor is plugged into the wall.  Your physician recommends that you schedule a follow-up appointment in: 12 months, and with Dr.Kelly as scheduled.

## 2013-10-11 ENCOUNTER — Ambulatory Visit: Payer: Medicare Other | Admitting: Cardiovascular Disease

## 2013-10-11 ENCOUNTER — Encounter: Payer: Self-pay | Admitting: Cardiovascular Disease

## 2013-10-11 NOTE — Progress Notes (Signed)
Patient ID: [name], male   DOB: 1966/11/10, 47 y.o.   MRN: [id number]     Reason for office visit ICD evaluation  HPI: The patient is a 47 year old African American gentleman who has a history of nonischemic cardiomyopathy.He was cathed in 1998 and 2005 showing minor CAD. In 2003 ejection fraction was 15-20% at which time he had insertion of an ICD. He is followed by Dr Tresa Endo. He did have one episode of VT that he was paced out of in June 2013. Dr Tresa Endo saw him in Dec 2013. His last echo was Nov 2012 and his EF was 35-40%.  He was seen recently by Corine Shelter for  complaints of chest pain, likely musculoskeletal, now resolved.   Allergies  Allergen Reactions  . Nitroglycerin Hives    Sub lingual broke him out in hives 2009    Current Outpatient Prescriptions  Medication Sig Dispense Refill  . aspirin 325 MG tablet Take 325 mg by mouth daily.      . carvedilol (COREG) 25 MG tablet Take 50 mg by mouth 2 (two) times daily with a meal.       . cholecalciferol (VITAMIN D) 1000 UNITS tablet Take 1,000 Units by mouth daily.      . digoxin (LANOXIN) 0.25 MG tablet Take 0.25 mg by mouth daily.      . furosemide (LASIX) 40 MG tablet Take 1 tablet (40 mg total) by mouth daily.  30 tablet  6  . metFORMIN (GLUCOPHAGE) 500 MG tablet Take 1 tablet (500 mg total) by mouth 2 (two) times daily with a meal.  60 tablet  11  . naproxen (NAPROSYN) 375 MG tablet TAKE 1 TABLET BY MOUTH TWICE DAILY WITH A MEAL  180 tablet  0  . omeprazole (PRILOSEC) 20 MG capsule TAKE ONE CAPSULE BY MOUTH DAILY TAKE WITH NAPROSYN  90 capsule  3  . potassium chloride (MICRO-K) 10 MEQ CR capsule Take 10 mEq by mouth daily.      Marland Kitchen UNABLE TO FIND CPAP THERAPHY      . lovastatin (MEVACOR) 20 MG tablet Take 20 mg by mouth daily.      Marland Kitchen spironolactone (ALDACTONE) 25 MG tablet Take 25 mg by mouth 2 (two) times daily.      . valsartan (DIOVAN) 160 MG tablet Take 160 mg by mouth 2 (two) times daily.       No current  facility-administered medications for this visit.    Past Medical History  Diagnosis Date  . Pacemaker   . CHF (congestive heart failure)   . Dysrhythmia   . Shortness of breath     with exertion  . Sleep apnea     cpap setting at 11   . Diabetes mellitus without complication     po meds  . GERD (gastroesophageal reflux disease)   . Arthritis   . Hypertension 11/24/11    ECHO-EF52%    Past Surgical History  Procedure Laterality Date  . Cardiac catheterization    . Right hand surgery       pins placed in hand due to accident   . Mass excision  10/21/2012    Procedure: EXCISION MASS;  Surgeon: Kathryne Hitch, MD;  Location: [facility];  Service: Orthopedics;  Laterality: Left;  Excision Left Ring Finger Mass  . Insert / replace / remove pacemaker      No family history on file.  History   Social History  . Marital Status: Married    Spouse  Name: N/A    Number of Children: N/A  . Years of Education: N/A   Occupational History  . Not on file.   Social History Main Topics  . Smoking status: Former Smoker    Types: Cigarettes    Quit date: 05/28/2013  . Smokeless tobacco: Never Used     Comment: still smokes a cigar once and a while  . Alcohol Use: Yes     Comment: 2x per week   . Drug Use: No  . Sexual Activity: Not on file   Other Topics Concern  . Not on file   Social History Narrative  . No narrative on file    Review of systems: The patient specifically denies any chest pain at rest exertion, dyspnea at rest or with exertion, orthopnea, paroxysmal nocturnal dyspnea, syncope, palpitations, focal neurological deficits, intermittent claudication, lower extremity edema, unexplained weight gain, cough, hemoptysis or wheezing.   PHYSICAL EXAM BP 120/62  Pulse 50  Resp 16  Ht 5\' 8"  (1.727 m)  Wt 199 lb (90.266 kg)  BMI 30.26 kg/m2  General: Alert, oriented x3, no distress Head: no evidence of trauma, PERRL, EOMI, no exophtalmos or lid lag, no  myxedema, no xanthelasma; normal ears, nose and oropharynx Neck: normal jugular venous pulsations and no hepatojugular reflux; brisk carotid pulses without delay and no carotid bruits Chest: clear to auscultation, no signs of consolidation by percussion or palpation, normal fremitus, symmetrical and full respiratory excursions, healthy left subclavian ICD site Cardiovascular: normal position and quality of the apical impulse, regular rhythm, normal first and second heart sounds, no murmurs, rubs or gallops Abdomen: no tenderness or distention, no masses by palpation, no abnormal pulsatility or arterial bruits, normal bowel sounds, no hepatosplenomegaly Extremities: no clubbing, cyanosis or edema; 2+ radial, ulnar and brachial pulses bilaterally; 2+ right femoral, posterior tibial and dorsalis pedis pulses; 2+ left femoral, posterior tibial and dorsalis pedis pulses; no subclavian or femoral bruits Neurological: grossly nonfocal   EKG: A paced V sensed, diffuse sagging ST segments, unchanged  Lipid Panel     Component Value Date/Time   CHOL  Value: 215        ATP III CLASSIFICATION:  <200     mg/dL   Desirable  409-811  mg/dL   Borderline High  >=914    mg/dL   High* 7/82/9562 1308   TRIG 165* 07/13/2008 0425   HDL 32* 07/13/2008 0425   CHOLHDL 6.7 07/13/2008 0425   VLDL 33 07/13/2008 0425   LDLCALC  Value: 150        Total Cholesterol/HDL:CHD Risk Coronary Heart Disease Risk Table                     Men   Women  1/2 Average Risk   3.4   3.3* 07/13/2008 0425    BMET    Component Value Date/Time   NA 142 02/28/2013 0950   K 4.1 02/28/2013 0950   CL 103 02/28/2013 0950   CO2 25 02/28/2013 0950   GLUCOSE 106* 02/28/2013 0950   BUN 13 02/28/2013 0950   CREATININE 1.04 02/28/2013 0950   CALCIUM 8.9 02/28/2013 0950   GFRNONAA 83* 02/28/2013 0950   GFRAA >90 02/28/2013 0950     ASSESSMENT AND PLAN ICD (implantable cardioverter-defibrillator) in place Normal comprehensive in office check. Continue Q3 month  remote monitoring and yearly in office follow up. Note 100% A pacing, no V pacing and no arrhythmic events. H does have a history of  appropriate ATP for VT over one year ago. No permanent changes were made to device settings.  Orders Placed This Encounter  Procedures  . ICD Device Observation  . EKG 12-Lead   Meds ordered this encounter  Medications  . spironolactone (ALDACTONE) 25 MG tablet    Sig: Take 25 mg by mouth 2 (two) times daily.  Marland Kitchen lovastatin (MEVACOR) 20 MG tablet    Sig: Take 20 mg by mouth daily.    Junious Silk, MD, [facility] [phone] office [phone] pager

## 2013-10-11 NOTE — Assessment & Plan Note (Signed)
Normal comprehensive in office check. Continue Q3 month remote monitoring and yearly in office follow up. Note 100% A pacing, no V pacing and no arrhythmic events. H does have a history of appropriate ATP for VT over one year ago. No permanent changes were made to device settings.

## 2013-10-12 ENCOUNTER — Encounter: Payer: Self-pay | Admitting: Cardiovascular Disease

## 2013-10-17 ENCOUNTER — Ambulatory Visit (HOSPITAL_COMMUNITY): Payer: Medicare Other

## 2013-10-24 ENCOUNTER — Other Ambulatory Visit: Payer: Self-pay | Admitting: Cardiovascular Disease

## 2013-10-24 ENCOUNTER — Ambulatory Visit: Payer: Medicare Other | Admitting: Cardiovascular Disease

## 2013-10-24 NOTE — Telephone Encounter (Signed)
Rx was sent to pharmacy electronically. 

## 2013-11-06 ENCOUNTER — Ambulatory Visit (HOSPITAL_COMMUNITY): Payer: Medicare Other

## 2013-11-07 ENCOUNTER — Ambulatory Visit: Payer: Medicare Other | Admitting: Cardiovascular Disease

## 2013-11-08 ENCOUNTER — Ambulatory Visit: Payer: Medicare Other | Admitting: Cardiovascular Disease

## 2013-12-12 ENCOUNTER — Ambulatory Visit (HOSPITAL_COMMUNITY)
Admission: RE | Admit: 2013-12-12 | Discharge: 2013-12-12 | Disposition: A | Discharge status: Home / Self Care | Payer: Medicare Other | Source: Ambulatory Visit | Attending: Cardiology | Admitting: Cardiology

## 2013-12-12 DIAGNOSIS — I10 Essential (primary) hypertension: Secondary | ICD-10-CM | POA: Insufficient documentation

## 2013-12-12 DIAGNOSIS — I509 Heart failure, unspecified: Secondary | ICD-10-CM | POA: Insufficient documentation

## 2013-12-12 DIAGNOSIS — R0609 Other forms of dyspnea: Secondary | ICD-10-CM | POA: Insufficient documentation

## 2013-12-12 DIAGNOSIS — R079 Chest pain, unspecified: Secondary | ICD-10-CM | POA: Insufficient documentation

## 2013-12-12 DIAGNOSIS — R0989 Other specified symptoms and signs involving the circulatory and respiratory systems: Secondary | ICD-10-CM | POA: Insufficient documentation

## 2013-12-12 DIAGNOSIS — I428 Other cardiomyopathies: Secondary | ICD-10-CM

## 2013-12-12 NOTE — Progress Notes (Signed)
2D Echo Performed 12/12/2013    Clearence Ped, RCS

## 2013-12-26 ENCOUNTER — Ambulatory Visit (INDEPENDENT_AMBULATORY_CARE_PROVIDER_SITE_OTHER): Payer: Medicare Other | Admitting: Cardiovascular Disease

## 2013-12-26 ENCOUNTER — Encounter: Payer: Self-pay | Admitting: Cardiovascular Disease

## 2013-12-26 VITALS — BP 122/84 | HR 56 | Ht 68.0 in | Wt 200.1 lb

## 2013-12-26 DIAGNOSIS — I2510 Atherosclerotic heart disease of native coronary artery without angina pectoris: Secondary | ICD-10-CM

## 2013-12-26 DIAGNOSIS — G4733 Obstructive sleep apnea (adult) (pediatric): Secondary | ICD-10-CM

## 2013-12-26 DIAGNOSIS — Z79899 Other long term (current) drug therapy: Secondary | ICD-10-CM

## 2013-12-26 DIAGNOSIS — R5383 Other fatigue: Secondary | ICD-10-CM

## 2013-12-26 DIAGNOSIS — I10 Essential (primary) hypertension: Secondary | ICD-10-CM

## 2013-12-26 DIAGNOSIS — R5381 Other malaise: Secondary | ICD-10-CM

## 2013-12-26 DIAGNOSIS — E669 Obesity, unspecified: Secondary | ICD-10-CM

## 2013-12-26 NOTE — Patient Instructions (Signed)
Your physician recommends that you schedule a follow-up appointment in: 6 months with Dr Tresa Endo  Your physician recommends that you return for FASTING  lab work in: 1 WEEK  DECREASE ASPIRIN TO 81 MG DAILY

## 2013-12-26 NOTE — Progress Notes (Signed)
Patient ID: [name], male   DOB: October 31, 1966, 47 y.o.   MRN: [id number]      HPI: [name], is a 47 y.o. male who presents to the office today for a six-month followup cardiology assessment.    [name] is a 47 year old African American gentleman who has a history of a nonischemic cardiomyopathy. In 2003 his ejection fraction was 15-20% at which time he had insertion of an ICD. An echo Doppler study in November 2012 showed improved ejection fraction of 35-40%.  In June 2013 pacemaker interrogation was done and he was found to have one episode of VT in excess of 200 beats per minute he was paced back into sinus rhythm.  Patient also has a history of diabetes mellitus, hypertension and has obstructive sleep apnea and uses his CPAP therapy. Since I last saw him, he admits to feeling well. He denies any defibrillator discharge. He was seen by Dr. Royann Shivers in followup of his ICD and this was functioning appropriately.  He has had some vague musculoskeletal-like chest discomfort for which he had seen to orient this fall and this has resolved.  [name] did undergo a followup echo Doppler study on 12/12/2013. Ejection fraction is now improved to 50-55%. There was grade 1 diastolic dysfunction. Chamber dimensions were normal.  [name] admits to weight loss. As result, he has taking his metformin which he had been taking for his diabetes and states when he checks his blood sugar at home is typically runs in the 90s.   Past Medical History  Diagnosis Date  . Pacemaker   . CHF (congestive heart failure)   . Dysrhythmia   . Shortness of breath     with exertion  . Sleep apnea     cpap setting at 11   . Diabetes mellitus without complication     po meds  . GERD (gastroesophageal reflux disease)   . Arthritis   . Hypertension 11/24/11    ECHO-EF52%    Past Surgical History  Procedure Laterality Date  . Cardiac catheterization    . Right hand surgery       pins placed in hand due to accident   .  Mass excision  10/21/2012    Procedure: EXCISION MASS;  Surgeon: Kathryne Hitch, MD;  Location: [facility];  Service: Orthopedics;  Laterality: Left;  Excision Left Ring Finger Mass  . Insert / replace / remove pacemaker      Allergies  Allergen Reactions  . Nitroglycerin Hives    Sub lingual broke him out in hives 2009    Current Outpatient Prescriptions  Medication Sig Dispense Refill  . aspirin 325 MG tablet Take 325 mg by mouth daily.      . carvedilol (COREG) 25 MG tablet Take 50 mg by mouth 2 (two) times daily with a meal.       . cholecalciferol (VITAMIN D) 1000 UNITS tablet Take 1,000 Units by mouth daily.      Marland Kitchen DIGOX 250 MCG tablet TAKE 1 TABLET BY MOUTH EVERY DAY  30 tablet  6  . esomeprazole (NEXIUM) 40 MG capsule Take 40 mg by mouth as needed.      . furosemide (LASIX) 40 MG tablet Take 1 tablet (40 mg total) by mouth daily.  30 tablet  6  . HYDROcodone-acetaminophen (NORCO/VICODIN) 5-325 MG per tablet Take 1 tablet by mouth every 6 (six) hours as needed.       Marland Kitchen ibuprofen (ADVIL,MOTRIN) 800 MG tablet Take 800 mg  by mouth every 6 (six) hours as needed.       . lovastatin (MEVACOR) 20 MG tablet Take 20 mg by mouth daily.      . naproxen (NAPROSYN) 375 MG tablet TAKE 1 TABLET BY MOUTH TWICE DAILY WITH A MEAL  180 tablet  0  . potassium chloride (MICRO-K) 10 MEQ CR capsule Take 10 mEq by mouth daily.      Marland Kitchen spironolactone (ALDACTONE) 25 MG tablet Take 25 mg by mouth 2 (two) times daily.      Marland Kitchen UNABLE TO FIND CPAP THERAPHY      . valsartan (DIOVAN) 160 MG tablet Take 160 mg by mouth 2 (two) times daily.       No current facility-administered medications for this visit.    He is married has 7 children 2 grandchildren. He states he needs to get a new primary care physician. In the past he had seen Dr. Eula Listen. He recent tobacco use.  ROS is negative for fever chills or night sweats. He denies any skin rash. He denies any change in vision or hearing. He is unaware  lymphadenopathy. He denies presyncope or syncope. There is no PND or orthopnea. He is unaware of any arrhythmia. He is unaware of any defibrillator discharge. Previous musculoskeletal-like chest discomfort has resolved. He denies nausea vomiting or diarrhea. He denies blood in stool or urine. He does walk. He denies claudication per He denies residual daytime sleepiness. When he uses CPAP he does not snore. He states he needs a new CPAP mask since his most recent one has a small crack.  There is no bleeding. Other comprehensive  14 point system review is negative.  PE BP 122/84  Pulse 56  Ht 5\' 8"  (1.727 m)  Wt 200 lb 1.6 oz (90.765 kg)  BMI 30.43 kg/m2  General: Alert, oriented, no distress.  HEENT: Normocephalic, atraumatic. Pupils round and reactive; sclera anicteric;no lid lag,  Nose without nasal septal hypertrophy Mouth/Parynx benign; Mallinpatti scale 3 Neck: Thick neck; No JVD, no carotid briuts Lungs: clear to ausculatation and percussion; no wheezing or rales Chest wall: No tenderness to palpation Heart: RRR, s1 s2 normal 1/6 systolic murmur. No S3. Abdomen: soft, nontender; no hepatosplenomehaly, BS+; abdominal aorta nontender and not dilated by palpation. Back: No CVA tenderness Pulses 2+ Extremities: no clubbing cyanosis or edema, Homan's sign negative  Neurologic: grossly nonfocal; cranial nerves grossly normal. Psychological: Normal affect and mood  ECG: Sinus bradycardia 56 beats per minute with nonspecific T wave abnormality.  LABS:  BMET    Component Value Date/Time   NA 142 02/28/2013 0950   K 4.1 02/28/2013 0950   CL 103 02/28/2013 0950   CO2 25 02/28/2013 0950   GLUCOSE 106* 02/28/2013 0950   BUN 13 02/28/2013 0950   CREATININE 1.04 02/28/2013 0950   CALCIUM 8.9 02/28/2013 0950   GFRNONAA 83* 02/28/2013 0950   GFRAA >90 02/28/2013 0950     Hepatic Function Panel     Component Value Date/Time   PROT 6.3 07/13/2008 0425   ALBUMIN 3.6 07/13/2008 0425   AST 34 07/13/2008  0425   ALT 36 07/13/2008 0425   ALKPHOS 51 07/13/2008 0425   BILITOT 1.0 07/13/2008 0425     CBC    Component Value Date/Time   WBC 9.7 02/28/2013 0950   RBC 4.77 02/28/2013 0950   HGB 14.6 02/28/2013 0950   HCT 41.7 02/28/2013 0950   PLT 258 02/28/2013 0950   MCV 87.4 02/28/2013 0950  MCH 30.6 02/28/2013 0950   MCHC 35.0 02/28/2013 0950   RDW 13.2 02/28/2013 0950   LYMPHSABS 3.7 07/12/2008 2000   MONOABS 1.0 07/12/2008 2000   EOSABS 0.2 07/12/2008 2000   BASOSABS 0.1 07/12/2008 2000     BNP    Component Value Date/Time   PROBNP 317.6* 02/28/2013 0950    Lipid Panel     Component Value Date/Time   CHOL  Value: 215        ATP III CLASSIFICATION:  <200     mg/dL   Desirable  161-096  mg/dL   Borderline High  >=045    mg/dL   High* 04/05/8118 1478   TRIG 165* 07/13/2008 0425   HDL 32* 07/13/2008 0425   CHOLHDL 6.7 07/13/2008 0425   VLDL 33 07/13/2008 0425   LDLCALC  Value: 150        Total Cholesterol/HDL:CHD Risk Coronary Heart Disease Risk Table                     Men   Women  1/2 Average Risk   3.4   3.3* 07/13/2008 0425     RADIOLOGY: No results found.    ASSESSMENT AND PLAN: [name] has a remote history of a nonischemic cardiomyopathy with initial ejection fraction of 15-20% which essentially is now in the low normal range at 50-55% on his most recent echo Doppler study 2 weeks ago. Clinically, his blood pressure is controlled. He is on BuSpar lactone 25 mg twice a day as well as Diovan 320 mg as well as furosemide 40 mg. Blood pressure is controlled. He's not having signs of heart failure. His rate is stable on carvedilol 50 mg to twice a day. He also takes lovastatin 20 mg for hyperlipidemia. He's no longer taking diabetic medication. He has been on aspirin 325 mg and I recommended he reduce this to 81 mg. I am recommending that laboratory be checked in the fasting state consisting of CBC, CMP, hemoglobin A1c, digoxin level, thyroid function studies, as well as lipid studies. I commended him  on his weight loss and activity. Contacted regarding results of laboratory and make changes as necessary to his medical regimen. I will see him in 6 months for cardiology reevaluation.   Nicholas Bihari, MD, [facility]  12/26/2013 10:14 AM

## 2014-01-06 ENCOUNTER — Other Ambulatory Visit: Payer: Self-pay | Admitting: Cardiovascular Disease

## 2014-01-08 NOTE — Telephone Encounter (Signed)
Rx was sent to pharmacy electronically. 

## 2014-01-13 ENCOUNTER — Other Ambulatory Visit: Payer: Self-pay | Admitting: Cardiovascular Disease

## 2014-01-15 ENCOUNTER — Other Ambulatory Visit: Payer: Self-pay | Admitting: *Deleted

## 2014-01-17 LAB — HEMOGLOBIN A1C
Hgb A1c MFr Bld: 5.4 % (ref <5.7)
Mean Plasma Glucose: 108 mg/dL (ref <117)

## 2014-01-17 LAB — CBC
HCT: 41.6 % (ref 39.0–52.0)
Hemoglobin: 14.2 g/dL (ref 13.0–17.0)
MCH: 30.1 pg (ref 26.0–34.0)
MCHC: 34.1 g/dL (ref 30.0–36.0)
MCV: 88.3 fL (ref 78.0–100.0)
Platelets: 243 10*3/uL (ref 150–400)
RBC: 4.71 MIL/uL (ref 4.22–5.81)
RDW: 13.9 % (ref 11.5–15.5)
WBC: 8.6 10*3/uL (ref 4.0–10.5)

## 2014-01-18 LAB — LIPID PANEL
Cholesterol: 179 mg/dL (ref 0–200)
HDL: 64 mg/dL (ref >39)
LDL Cholesterol: 90 mg/dL (ref 0–99)
Total CHOL/HDL Ratio: 2.8 Ratio
Triglycerides: 125 mg/dL (ref <150)
VLDL: 25 mg/dL (ref 0–40)

## 2014-01-18 LAB — COMPREHENSIVE METABOLIC PANEL
ALT: 22 U/L (ref 0–53)
AST: 23 U/L (ref 0–37)
Albumin: 4.4 g/dL (ref 3.5–5.2)
Alkaline Phosphatase: 37 U/L — ABNORMAL LOW (ref 39–117)
BUN: 11 mg/dL (ref 6–23)
CO2: 25 mEq/L (ref 19–32)
Calcium: 8.9 mg/dL (ref 8.4–10.5)
Chloride: 108 mEq/L (ref 96–112)
Creat: 1.07 mg/dL (ref 0.50–1.35)
Glucose, Bld: 91 mg/dL (ref 70–99)
Potassium: 4.1 mEq/L (ref 3.5–5.3)
Sodium: 142 mEq/L (ref 135–145)
Total Bilirubin: 0.4 mg/dL (ref 0.3–1.2)
Total Protein: 6.8 g/dL (ref 6.0–8.3)

## 2014-01-18 LAB — DIGOXIN LEVEL: Digoxin Level: 0.4 ng/mL — ABNORMAL LOW (ref 0.8–2.0)

## 2014-01-18 LAB — TSH: TSH: 1.811 u[IU]/mL (ref 0.350–4.500)

## 2014-03-28 ENCOUNTER — Other Ambulatory Visit: Payer: Self-pay | Admitting: Cardiovascular Disease

## 2014-03-28 NOTE — Telephone Encounter (Signed)
Rx was sent to pharmacy electronically. 

## 2014-04-09 ENCOUNTER — Other Ambulatory Visit: Payer: Self-pay | Admitting: Cardiovascular Disease

## 2014-04-09 NOTE — Telephone Encounter (Signed)
Rx was sent to pharmacy electronically. 

## 2014-06-16 ENCOUNTER — Other Ambulatory Visit: Payer: Self-pay | Admitting: Cardiovascular Disease

## 2014-06-18 ENCOUNTER — Other Ambulatory Visit: Payer: Self-pay | Admitting: Gastroenterology

## 2014-06-19 NOTE — Telephone Encounter (Signed)
Rx was sent to pharmacy electronically. 

## 2014-06-21 ENCOUNTER — Encounter (HOSPITAL_COMMUNITY): Payer: Self-pay | Admitting: Pharmacy Technician

## 2014-07-05 ENCOUNTER — Encounter (HOSPITAL_COMMUNITY): Payer: Self-pay | Admitting: *Deleted

## 2014-07-06 ENCOUNTER — Telehealth: Payer: Self-pay | Admitting: *Deleted

## 2014-07-06 NOTE — Telephone Encounter (Signed)
Signed clearance for perioperative Rx for implanted Cardiac Device [employer] .  Needs device check - will have scheduling call to make the appt.

## 2014-07-10 ENCOUNTER — Emergency Department (HOSPITAL_COMMUNITY)
Admission: EM | Admit: 2014-07-10 | Discharge: 2014-07-10 | Disposition: A | Discharge status: Home / Self Care | Payer: Medicare Other | Attending: Emergency Medicine | Admitting: Emergency Medicine

## 2014-07-10 ENCOUNTER — Encounter (HOSPITAL_COMMUNITY): Payer: Self-pay | Admitting: Emergency Medicine

## 2014-07-10 ENCOUNTER — Emergency Department (HOSPITAL_COMMUNITY): Payer: Medicare Other

## 2014-07-10 DIAGNOSIS — I509 Heart failure, unspecified: Secondary | ICD-10-CM | POA: Diagnosis not present

## 2014-07-10 DIAGNOSIS — M129 Arthropathy, unspecified: Secondary | ICD-10-CM | POA: Diagnosis not present

## 2014-07-10 DIAGNOSIS — I10 Essential (primary) hypertension: Secondary | ICD-10-CM | POA: Insufficient documentation

## 2014-07-10 DIAGNOSIS — E119 Type 2 diabetes mellitus without complications: Secondary | ICD-10-CM | POA: Diagnosis not present

## 2014-07-10 DIAGNOSIS — Y929 Unspecified place or not applicable: Secondary | ICD-10-CM | POA: Insufficient documentation

## 2014-07-10 DIAGNOSIS — Z23 Encounter for immunization: Secondary | ICD-10-CM | POA: Diagnosis not present

## 2014-07-10 DIAGNOSIS — S8990XA Unspecified injury of unspecified lower leg, initial encounter: Secondary | ICD-10-CM | POA: Diagnosis present

## 2014-07-10 DIAGNOSIS — Z7982 Long term (current) use of aspirin: Secondary | ICD-10-CM | POA: Diagnosis not present

## 2014-07-10 DIAGNOSIS — G4730 Sleep apnea, unspecified: Secondary | ICD-10-CM | POA: Insufficient documentation

## 2014-07-10 DIAGNOSIS — Z87891 Personal history of nicotine dependence: Secondary | ICD-10-CM | POA: Diagnosis not present

## 2014-07-10 DIAGNOSIS — Z950 Presence of cardiac pacemaker: Secondary | ICD-10-CM | POA: Diagnosis not present

## 2014-07-10 DIAGNOSIS — Z79899 Other long term (current) drug therapy: Secondary | ICD-10-CM | POA: Diagnosis not present

## 2014-07-10 DIAGNOSIS — IMO0002 Reserved for concepts with insufficient information to code with codable children: Secondary | ICD-10-CM | POA: Insufficient documentation

## 2014-07-10 DIAGNOSIS — S91309A Unspecified open wound, unspecified foot, initial encounter: Secondary | ICD-10-CM | POA: Insufficient documentation

## 2014-07-10 DIAGNOSIS — Y9389 Activity, other specified: Secondary | ICD-10-CM | POA: Insufficient documentation

## 2014-07-10 DIAGNOSIS — W278XXA Contact with other nonpowered hand tool, initial encounter: Secondary | ICD-10-CM | POA: Insufficient documentation

## 2014-07-10 DIAGNOSIS — Z9981 Dependence on supplemental oxygen: Secondary | ICD-10-CM | POA: Diagnosis not present

## 2014-07-10 DIAGNOSIS — S91331A Puncture wound without foreign body, right foot, initial encounter: Secondary | ICD-10-CM

## 2014-07-10 DIAGNOSIS — S99919A Unspecified injury of unspecified ankle, initial encounter: Secondary | ICD-10-CM | POA: Diagnosis present

## 2014-07-10 MED ORDER — HYDROCODONE-ACETAMINOPHEN 5-325 MG PO TABS
1.0000 | ORAL_TABLET | Freq: Once | ORAL | Status: AC
  Administered 2014-07-10: 1 via ORAL
  Filled 2014-07-10: qty 1

## 2014-07-10 MED ORDER — TETANUS-DIPHTH-ACELL PERTUSSIS 5-2.5-18.5 LF-MCG/0.5 IM SUSP
0.5000 mL | Freq: Once | INTRAMUSCULAR | Status: AC
  Administered 2014-07-10: 0.5 mL via INTRAMUSCULAR
  Filled 2014-07-10: qty 0.5

## 2014-07-10 MED ORDER — HYDROCODONE-ACETAMINOPHEN 5-325 MG PO TABS: 1.0000 | ORAL_TABLET | Freq: Four times a day (QID) | ORAL | Status: DC | PRN

## 2014-07-10 MED ORDER — CIPROFLOXACIN HCL 500 MG PO TABS: 500.0000 mg | ORAL_TABLET | Freq: Two times a day (BID) | ORAL | Status: DC

## 2014-07-10 NOTE — ED Provider Notes (Signed)
Medical screening examination/treatment/procedure(s) were performed by non-physician practitioner and as supervising physician I was immediately available for consultation/collaboration.  Richarda Blade, MD 07/10/14 [phone]

## 2014-07-10 NOTE — Discharge Instructions (Signed)
Please soak feet several times daily with water and idone mixture, or warm soapy water to decrease risk of infection. Take antibiotic as prescribed.  If you notice worsening signs of infection then follow up with your doctor or with orthopedist for further care.    Puncture Wound A puncture wound is an injury that extends through all layers of the skin and into the tissue beneath the skin (subcutaneous tissue). Puncture wounds become infected easily because germs often enter the body and go beneath the skin during the injury. Having a deep wound with a small entrance point makes it difficult for your caregiver to adequately clean the wound. This is especially true if you have stepped on a nail and it has passed through a dirty shoe or other situations where the wound is obviously contaminated. CAUSES  Many puncture wounds involve glass, nails, splinters, fish hooks, or other objects that enter the skin (foreign bodies). A puncture wound may also be caused by a human bite or animal bite. DIAGNOSIS  A puncture wound is usually diagnosed by your history and a physical exam. You may need to have an X-ray or an ultrasound to check for any foreign bodies still in the wound. TREATMENT   Your caregiver will clean the wound as thoroughly as possible. Depending on the location of the wound, a bandage (dressing) may be applied.  Your caregiver might prescribe antibiotic medicines.  You may need a follow-up visit to check on your wound. Follow all instructions as directed by your caregiver. HOME CARE INSTRUCTIONS   Change your dressing once per day, or as directed by your caregiver. If the dressing sticks, it may be removed by soaking the area in water.  If your caregiver has given you follow-up instructions, it is very important that you return for a follow-up appointment. Not following up as directed could result in a chronic or permanent injury, pain, and disability.  Only take over-the-counter or  prescription medicines for pain, discomfort, or fever as directed by your caregiver.  If you are given antibiotics, take them as directed. Finish them even if you start to feel better. You may need a tetanus shot if:  You cannot remember when you had your last tetanus shot.  You have never had a tetanus shot. If you got a tetanus shot, your arm may swell, get red, and feel warm to the touch. This is common and not a problem. If you need a tetanus shot and you choose not to have one, there is a rare chance of getting tetanus. Sickness from tetanus can be serious. You may need a rabies shot if an animal bite caused your puncture wound. SEEK MEDICAL CARE IF:   You have redness, swelling, or increasing pain in the wound.  You have red streaks going away from the wound.  You notice a bad smell coming from the wound or dressing.  You have yellowish-white fluid (pus) coming from the wound.  You are treated with an antibiotic for infection, but the infection is not getting better.  You notice something in the wound, such as rubber from your shoe, cloth, or another object.  You have a fever.  You have severe pain.  You have difficulty breathing.  You feel dizzy or faint.  You cannot stop vomiting.  You lose feeling, develop numbness, or cannot move a limb below the wound.  Your symptoms worsen. MAKE SURE YOU:  Understand these instructions.  Will watch your condition.  Will get help right away  if you are not doing well or get worse. Document Released: 09/23/2005 Document Revised: 03/07/2012 Document Reviewed: 06/02/2011 [facility] Patient Information 2015 [place], [place]. This information is not intended to replace advice given to you by your health care provider. Make sure you discuss any questions you have with your health care provider.

## 2014-07-10 NOTE — ED Notes (Signed)
Pt states he stepped on a pitch fork and it pierced his R foot. Pt states pitch fork went through his shoe. Pt has puncture wound to bottom of R foot. Bleeding controlled. Pt unsure of when his last tetanus shot was.

## 2014-07-10 NOTE — ED Notes (Signed)
Pt has a ride home.  

## 2014-07-10 NOTE — ED Provider Notes (Signed)
CSN: [id number]     Arrival date & time 07/10/14  1646 History  This chart was scribed for non-physician practitioner Domenic Moras, working with Richarda Blade, MD by Donato Schultz, ED Scribe. This patient was seen in room WTR8/WTR8 and the patient's care was started at 5:51 PM.    Chief Complaint  Patient presents with  . Foot Injury    Patient is a 48 y.o. male presenting with foot injury. The history is provided by the patient. No language interpreter was used.  Foot Injury  HPI Comments: [name] is a 48 y.o. male with a history of pacemaker placement, CHF, DM, and Hypertension who presents to the Emergency Department complaining of a painful wound to the underside of his right foot that he incurred an hour ago today after he stepped on a pitch fork that pierced through his shoe.  The patient denies numbness as an associated symptom.  The patient does not remember when his last TDAP was.  He states that he smokes cigars every once in a while.   Past Medical History  Diagnosis Date  . Pacemaker   . CHF (congestive heart failure)   . Dysrhythmia   . Shortness of breath     with exertion  . Sleep apnea     cpap setting at 11   . Diabetes mellitus without complication     po meds  . GERD (gastroesophageal reflux disease)   . Arthritis   . Hypertension 11/24/11    ECHO-EF52%   Past Surgical History  Procedure Laterality Date  . Cardiac catheterization    . Right hand surgery       pins placed in hand due to accident   . Mass excision  10/21/2012    Procedure: EXCISION MASS;  Surgeon: Mcarthur Rossetti, MD;  Location: [facility];  Service: Orthopedics;  Laterality: Left;  Excision Left Ring Finger Mass  . Insert / replace / remove pacemaker     No family history on file. History  Substance Use Topics  . Smoking status: Former Smoker    Types: Cigarettes    Quit date: 05/28/2013  . Smokeless tobacco: Never Used     Comment: still smokes a cigar once and a while  . Alcohol  Use: Yes     Comment: 2x per week     Review of Systems  Musculoskeletal: Positive for arthralgias.  Skin: Positive for wound. Negative for color change, pallor and rash.  Neurological: Negative for numbness.  Hematological: Does not bruise/bleed easily.  All other systems reviewed and are negative.     Allergies  Nitroglycerin  Home Medications   Prior to Admission medications   Medication Sig Start Date End Date Taking? Authorizing Provider  acetaminophen (TYLENOL) 325 MG tablet Take 325 mg by mouth every 6 (six) hours as needed for mild pain.   Yes Historical Provider, MD  aspirin EC 81 MG tablet Take 81 mg by mouth every morning.    Yes Historical Provider, MD  carvedilol (COREG) 25 MG tablet Take 50 mg by mouth 2 (two) times daily with a meal.   Yes Historical Provider, MD  cholecalciferol (VITAMIN D) 1000 UNITS tablet Take 1,000 Units by mouth daily.   Yes Historical Provider, MD  digoxin (LANOXIN) 0.25 MG tablet Take 0.25 mg by mouth every morning.   Yes Historical Provider, MD  ferrous sulfate 325 (65 FE) MG EC tablet Take 325 mg by mouth daily with breakfast.   Yes Historical Provider, MD  furosemide (LASIX) 40 MG tablet Take 40 mg by mouth every morning.   Yes Historical Provider, MD  ibuprofen (ADVIL,MOTRIN) 800 MG tablet Take 800 mg by mouth every 6 (six) hours as needed for moderate pain.  11/07/13  Yes Historical Provider, MD  metFORMIN (GLUCOPHAGE) 500 MG tablet Take 500 mg by mouth 2 (two) times daily with a meal.   Yes Historical Provider, MD  potassium chloride (K-DUR,KLOR-CON) 10 MEQ tablet Take 10 mEq by mouth daily.   Yes Historical Provider, MD  rosuvastatin (CRESTOR) 20 MG tablet Take 20 mg by mouth every morning.   Yes Historical Provider, MD  spironolactone (ALDACTONE) 25 MG tablet Take 25 mg by mouth 2 (two) times daily.   Yes Historical Provider, MD  valsartan (DIOVAN) 160 MG tablet Take 160 mg by mouth 2 (two) times daily.   Yes Historical Provider, MD   UNABLE TO FIND CPAP THERAPHY    Historical Provider, MD   Triage Vitals: BP 162/98  Pulse 87  Temp(Src) 98.8 F (37.1 C) (Oral)  Resp 16  SpO2 97%  Physical Exam  Nursing note and vitals reviewed. Constitutional: He is oriented to person, place, and time. He appears well-developed and well-nourished.  HENT:  Head: Normocephalic and atraumatic.  Eyes: EOM are normal.  Neck: Normal range of motion.  Cardiovascular: Normal rate.   Pulmonary/Chest: Effort normal.  Musculoskeletal: Normal range of motion.  Neurological: He is alert and oriented to person, place, and time.  Skin: Skin is warm and dry.  Right foot: less than 1 cm puncture wound to the mid-sole of the foot.  No foreign object noted.  Sensation is intact distally.    Psychiatric: He has a normal mood and affect. His behavior is normal.    ED Course  Procedures (including critical care time)  DIAGNOSTIC STUDIES: Oxygen Saturation is 97% on room air, adequate by my interpretation.    COORDINATION OF CARE: 5:53 PM- Discussed numbing the patient's right foot and irrigating the wound before discharging the patient and administering pain medication in the ED.  Advised the patient to follow-up with an orthopedist.  The patient agreed to the treatment plan.   6:33 PM Wound was anesthetized,cleansed and thoroughly irrigated with approximately 433ml of NS.  Wound left open and pt recommended to soak aggressively and have close follow up.  Cipro and pain medication prescribed.  Return precaution discussed for signs of infection.  Pt otherwise NVI.    LACERATION REPAIR Performed by: Domenic Moras Authorized byDomenic Moras Consent: Verbal consent obtained. Risks and benefits: risks, benefits and alternatives were discussed Consent given by: patient Patient identity confirmed: provided demographic data Prepped and Draped in normal sterile fashion Wound explored  Laceration Location: R foot, mid sole  Laceration Length:  0.7cm  No Foreign Bodies seen or palpated  Anesthesia: local infiltration  Local anesthetic: lidocaine 2% w/o epinephrine  Anesthetic total: 3 ml  Irrigation method: syringe Amount of cleaning: standard  Skin closure: none  Number of sutures: none  Technique: none  Patient tolerance: Patient tolerated the procedure well with no immediate complications.  Labs Review Labs Reviewed - No data to display  Imaging Review No results found.   EKG Interpretation None      MDM   Final diagnoses:  Puncture wound of right foot without foreign body, initial encounter    BP 162/98  Pulse 87  Temp(Src) 98.8 F (37.1 C) (Oral)  Resp 16  SpO2 97%  I personally performed the services described in this  documentation, which was scribed in my presence. The recorded information has been reviewed and is accurate.     Domenic Moras, PA-C 07/10/14 [phone]

## 2014-07-12 ENCOUNTER — Ambulatory Visit (INDEPENDENT_AMBULATORY_CARE_PROVIDER_SITE_OTHER): Payer: Medicare Other | Admitting: *Deleted

## 2014-07-12 ENCOUNTER — Encounter: Payer: Self-pay | Admitting: Cardiovascular Disease

## 2014-07-12 DIAGNOSIS — I428 Other cardiomyopathies: Secondary | ICD-10-CM

## 2014-07-12 NOTE — Progress Notes (Signed)
Remote ICD transmission.   

## 2014-07-13 ENCOUNTER — Encounter (HOSPITAL_COMMUNITY): Payer: Self-pay | Admitting: Certified Registered"

## 2014-07-13 ENCOUNTER — Ambulatory Visit (HOSPITAL_COMMUNITY)
Admission: RE | Admit: 2014-07-13 | Discharge: 2014-07-13 | Disposition: A | Discharge status: Home / Self Care | Payer: Medicare Other | Source: Ambulatory Visit | Attending: Gastroenterology | Admitting: Gastroenterology

## 2014-07-13 ENCOUNTER — Encounter (HOSPITAL_COMMUNITY): Payer: Medicare Other | Admitting: Anesthesiology

## 2014-07-13 ENCOUNTER — Encounter (HOSPITAL_COMMUNITY)
Admission: RE | Disposition: A | Discharge status: Home / Self Care | Payer: Self-pay | Source: Ambulatory Visit | Attending: Gastroenterology

## 2014-07-13 ENCOUNTER — Ambulatory Visit (HOSPITAL_COMMUNITY): Payer: Medicare Other | Admitting: Anesthesiology

## 2014-07-13 DIAGNOSIS — I509 Heart failure, unspecified: Secondary | ICD-10-CM | POA: Insufficient documentation

## 2014-07-13 DIAGNOSIS — D126 Benign neoplasm of colon, unspecified: Secondary | ICD-10-CM | POA: Insufficient documentation

## 2014-07-13 DIAGNOSIS — I2510 Atherosclerotic heart disease of native coronary artery without angina pectoris: Secondary | ICD-10-CM | POA: Diagnosis not present

## 2014-07-13 DIAGNOSIS — Z87891 Personal history of nicotine dependence: Secondary | ICD-10-CM | POA: Diagnosis not present

## 2014-07-13 DIAGNOSIS — Z95810 Presence of automatic (implantable) cardiac defibrillator: Secondary | ICD-10-CM | POA: Insufficient documentation

## 2014-07-13 DIAGNOSIS — Z1211 Encounter for screening for malignant neoplasm of colon: Secondary | ICD-10-CM | POA: Diagnosis not present

## 2014-07-13 DIAGNOSIS — G4730 Sleep apnea, unspecified: Secondary | ICD-10-CM | POA: Diagnosis not present

## 2014-07-13 DIAGNOSIS — I10 Essential (primary) hypertension: Secondary | ICD-10-CM | POA: Insufficient documentation

## 2014-07-13 DIAGNOSIS — E119 Type 2 diabetes mellitus without complications: Secondary | ICD-10-CM | POA: Insufficient documentation

## 2014-07-13 HISTORY — PX: COLONOSCOPY WITH PROPOFOL: SHX5780

## 2014-07-13 LAB — GLUCOSE, CAPILLARY: GLUCOSE-CAPILLARY: 86 mg/dL (ref 70–99)

## 2014-07-13 SURGERY — COLONOSCOPY WITH PROPOFOL
Anesthesia: Monitor Anesthesia Care

## 2014-07-13 MED ORDER — LACTATED RINGERS IV SOLN
INTRAVENOUS | Status: DC
  Administered 2014-07-13: 1000 mL via INTRAVENOUS

## 2014-07-13 MED ORDER — PROPOFOL 10 MG/ML IV BOLUS
INTRAVENOUS | Status: AC
  Filled 2014-07-13: qty 20

## 2014-07-13 MED ORDER — PROPOFOL INFUSION 10 MG/ML OPTIME
INTRAVENOUS | Status: DC | PRN
  Administered 2014-07-13: 300 ug/kg/min via INTRAVENOUS

## 2014-07-13 MED ORDER — PROMETHAZINE HCL 25 MG/ML IJ SOLN: 6.2500 mg | INTRAMUSCULAR | Status: DC | PRN

## 2014-07-13 MED ORDER — SODIUM CHLORIDE 0.9 % IV SOLN: INTRAVENOUS | Status: DC

## 2014-07-13 SURGICAL SUPPLY — 22 items

## 2014-07-13 NOTE — H&P (Signed)
  [name] HPI: The patient is here for a screening colonoscopy.  No reports of any GI issues.  He does have a history of CHF and is s/p AICD/pacemaker placement.  Past Medical History  Diagnosis Date  . Pacemaker   . CHF (congestive heart failure)   . Dysrhythmia   . Shortness of breath     with exertion  . Sleep apnea     cpap setting at 11   . Diabetes mellitus without complication     po meds  . GERD (gastroesophageal reflux disease)   . Arthritis   . Hypertension 11/24/11    ECHO-EF52%    Past Surgical History  Procedure Laterality Date  . Cardiac catheterization    . Right hand surgery       pins placed in hand due to accident   . Mass excision  10/21/2012    Procedure: EXCISION MASS;  Surgeon: Mcarthur Rossetti, MD;  Location: [facility];  Service: Orthopedics;  Laterality: Left;  Excision Left Ring Finger Mass  . Insert / replace / remove pacemaker      History reviewed. No pertinent family history.  Social History:  reports that he quit smoking about 13 months ago. His smoking use included Cigarettes. He smoked 0.00 packs per day. He has never used smokeless tobacco. He reports that he drinks alcohol. He reports that he does not use illicit drugs.  Allergies:  Allergies  Allergen Reactions  . Nitroglycerin Hives    Sub lingual broke him out in hives 2009    Medications: Scheduled: Continuous:  No results found for this or any previous visit (from the past 24 hour(s)).   No results found.  ROS:  As stated above in the HPI otherwise negative.  There were no vitals taken for this visit.    PE: Gen: NAD, Alert and Oriented HEENT:  [place]/AT, EOMI Neck: Supple, no LAD Lungs: CTA Bilaterally CV: RRR without M/G/R ABM: Soft, NTND, +BS Ext: No C/C/E  Assessment/Plan: 1) Screening colonoscopy.  Plan: 1) Colonoscopy.  [name] 07/13/2014, 10:55 AM

## 2014-07-13 NOTE — Anesthesia Postprocedure Evaluation (Signed)
  Anesthesia Post-op Note  Patient: [name]  Procedure(s) Performed: Procedure(s) (LRB): COLONOSCOPY WITH PROPOFOL (N/A)  Patient Location: PACU  Anesthesia Type: MAC  Level of Consciousness: awake and alert   Airway and Oxygen Therapy: Patient Spontanous Breathing  Post-op Pain: mild  Post-op Assessment: Post-op Vital signs reviewed, Patient's Cardiovascular Status Stable, Respiratory Function Stable, Patent Airway and No signs of Nausea or vomiting  Last Vitals:  Filed Vitals:   07/13/14 1242  BP: 151/89  Pulse: 61  Temp:   Resp: 16    Post-op Vital Signs: stable   Complications: No apparent anesthesia complications

## 2014-07-13 NOTE — Discharge Instructions (Addendum)
Colonoscopy, Care After Refer to this sheet in the next few weeks. These instructions provide you with information on caring for yourself after your procedure. Your health care provider may also give you more specific instructions. Your treatment has been planned according to current medical practices, but problems sometimes occur. Call your health care provider if you have any problems or questions after your procedure. WHAT TO EXPECT AFTER THE PROCEDURE  After your procedure, it is typical to have the following:  A small amount of blood in your stool.  Moderate amounts of gas and mild abdominal cramping or bloating. HOME CARE INSTRUCTIONS  Do not drive, operate machinery, or sign important documents for 24 hours.  You may shower and resume your regular physical activities, but move at a slower pace for the first 24 hours.  Take frequent rest periods for the first 24 hours.  Walk around or put a warm pack on your abdomen to help reduce abdominal cramping and bloating.  Drink enough fluids to keep your urine clear or pale yellow.  You may resume your normal diet as instructed by your health care provider. Avoid heavy or fried foods that are hard to digest.  Avoid drinking alcohol for 24 hours or as instructed by your health care provider.  Only take over-the-counter or prescription medicines as directed by your health care provider.  If a tissue sample (biopsy) was taken during your procedure:  Do not take aspirin or blood thinners for 7 days, or as instructed by your health care provider.  Do not drink alcohol for 7 days, or as instructed by your health care provider.  Eat soft foods for the first 24 hours. SEEK MEDICAL CARE IF: You have persistent spotting of blood in your stool 2-3 days after the procedure. SEEK IMMEDIATE MEDICAL CARE IF:  You have more than a small spotting of blood in your stool.  You pass large blood clots in your stool.  Your abdomen is swollen  (distended).  You have nausea or vomiting.  You have a fever.  You have increasing abdominal pain that is not relieved with medicine. Document Released: 07/28/2004 Document Revised: 10/04/2013 Document Reviewed: 08/21/2013 [facility] Patient Information 2015 [place], [place]. This information is not intended to replace advice given to you by your health care provider. Make sure you discuss any questions you have with your health care provider.   Friday, July 17th, 2015 Lorelle Gibbs, son/caregiver, drove patient to hospital, remained with patient during procedure,  and will provide care for him the remainder of the day.

## 2014-07-13 NOTE — Op Note (Signed)
[facility] [place], [id number]   OPERATIVE PROCEDURE REPORT  PATIENT: [name], [name]  MR#: [id number] BIRTHDATE: [birth year]  GENDER: Male ENDOSCOPIST: Carol Ada, MD ASSISTANT:   Tedra Coupe, Cleda Daub, RN CGRN PROCEDURE DATE: 07/13/2014 PROCEDURE:   Colonoscopy with snare polypectomy ASA CLASS:   Class III INDICATIONS:Colorectal cancer screening. MEDICATIONS: MAC sedation, administered by CRNA  DESCRIPTION OF PROCEDURE:   After the risks benefits and alternatives of the procedure were thoroughly explained, informed consent was obtained.  A digital rectal exam revealed no abnormalities of the rectum.    The Pentax Colonoscope [id number] endoscope was introduced through the anus  and advanced to the cecum, which was identified by both the appendix and ileocecal valve , No adverse events experienced.    The quality of the prep was excellent. .  The instrument was then slowly withdrawn as the colon was fully examined.     FINDINGS: A 2-3 mm sessile hepatic flexure polyp was removed with a cold snare.  No evidence of any masses, inflammation, ulcerations, erosions, or vascular abnormalities.   Retroflexed views revealed no abnormalities.     The scope was then withdrawn from the patient and the procedure terminated.  COMPLICATIONS: There were no complications.  IMPRESSION: 1) Polyp.  RECOMMENDATIONS: 1) Await biopsy reusults. 2) Repeat the colonoscopy in 5-10 years.  _______________________________ Lorrin MaisCarol Ada, MD 07/13/2014 12:10 PM

## 2014-07-13 NOTE — Anesthesia Preprocedure Evaluation (Addendum)
Anesthesia Evaluation  Patient identified by MRN, date of birth, ID band Patient awake    Reviewed: Allergy & Precautions, H&P , NPO status , Patient's Chart, lab work & pertinent test results  Airway Mallampati: II TM Distance: >3 FB Neck ROM: Full    Dental no notable dental hx.    Pulmonary sleep apnea and Continuous Positive Airway Pressure Ventilation , former smoker,  breath sounds clear to auscultation  Pulmonary exam normal       Cardiovascular hypertension, + CAD and +CHF + dysrhythmias Ventricular Tachycardia + Cardiac Defibrillator Rhythm:Regular Rate:Normal   Compared to the prior echo in 2012, the EF has improved   from 35-40% to 50-55%.    Neuro/Psych negative neurological ROS  negative psych ROS   GI/Hepatic negative GI ROS, Neg liver ROS,   Endo/Other  negative endocrine ROSdiabetes  Renal/GU negative Renal ROS  negative genitourinary   Musculoskeletal negative musculoskeletal ROS (+)   Abdominal   Peds negative pediatric ROS (+)  Hematology negative hematology ROS (+)   Anesthesia Other Findings   Reproductive/Obstetrics negative OB ROS                         Anesthesia Physical Anesthesia Plan  ASA: IV  Anesthesia Plan: MAC   Post-op Pain Management:    Induction: Intravenous  Airway Management Planned: Nasal Cannula  Additional Equipment:   Intra-op Plan:   Post-operative Plan:   Informed Consent: I have reviewed the patients History and Physical, chart, labs and discussed the procedure including the risks, benefits and alternatives for the proposed anesthesia with the patient or authorized representative who has indicated his/her understanding and acceptance.   Dental advisory given  Plan Discussed with: CRNA and Surgeon  Anesthesia Plan Comments:         Anesthesia Quick Evaluation

## 2014-07-13 NOTE — Transfer of Care (Signed)
Immediate Anesthesia Transfer of Care Note  Patient: [name]  Procedure(s) Performed: Procedure(s) with comments: COLONOSCOPY WITH PROPOFOL (N/A) - aicd  Patient Location: PACU  Anesthesia Type:MAC  Level of Consciousness: Patient easily awoken, sedated, comfortable, cooperative, following commands, responds to stimulation.   Airway & Oxygen Therapy: Patient spontaneously breathing, ventilating well, oxygen via simple oxygen mask.  Post-op Assessment: Report given to PACU RN, vital signs reviewed and stable, moving all extremities.   Post vital signs: Reviewed and stable.  Complications: No apparent anesthesia complications

## 2014-07-16 ENCOUNTER — Encounter (HOSPITAL_COMMUNITY): Payer: Self-pay | Admitting: Gastroenterology

## 2014-07-20 ENCOUNTER — Telehealth: Payer: Self-pay | Admitting: Cardiovascular Disease

## 2014-07-20 LAB — MDC_IDC_ENUM_SESS_TYPE_REMOTE
Battery Voltage: 3.09 V
HIGH POWER IMPEDANCE MEASURED VALUE: 45 Ohm
Implantable Pulse Generator Serial Number: 60474038
Lead Channel Impedance Value: 428 Ohm
Lead Channel Impedance Value: 542 Ohm
Lead Channel Pacing Threshold Amplitude: 0.8 V
Lead Channel Pacing Threshold Pulse Width: 0.4 ms
Lead Channel Sensing Intrinsic Amplitude: 2.9 mV
Lead Channel Setting Pacing Amplitude: 2 V
Lead Channel Setting Pacing Amplitude: 2 V
MDC IDC MSMT LEADCHNL RA SENSING INTR AMPL: 1.3 mV
MDC IDC SET LEADCHNL RV PACING PULSEWIDTH: 0.4 ms
MDC IDC SET ZONE DETECTION INTERVAL: 300 ms
MDC IDC SET ZONE DETECTION INTERVAL: 340.9 ms
MDC IDC STAT BRADY RA PERCENT PACED: 9 %
MDC IDC STAT BRADY RV PERCENT PACED: 0 %

## 2014-07-20 NOTE — Telephone Encounter (Signed)
Closed encounter °

## 2014-07-25 ENCOUNTER — Encounter: Payer: Self-pay | Admitting: Cardiology

## 2014-07-30 ENCOUNTER — Other Ambulatory Visit: Payer: Self-pay | Admitting: Cardiovascular Disease

## 2014-07-30 NOTE — Telephone Encounter (Signed)
Rx was sent to pharmacy electronically. 

## 2014-08-02 ENCOUNTER — Other Ambulatory Visit: Payer: Self-pay | Admitting: Orthopaedic Surgery

## 2014-08-02 DIAGNOSIS — M25571 Pain in right ankle and joints of right foot: Secondary | ICD-10-CM

## 2014-08-03 ENCOUNTER — Ambulatory Visit
Admission: RE | Admit: 2014-08-03 | Discharge: 2014-08-03 | Disposition: A | Discharge status: Home / Self Care | Payer: Medicare Other | Source: Ambulatory Visit | Attending: Orthopaedic Surgery | Admitting: Orthopaedic Surgery

## 2014-08-03 DIAGNOSIS — M25571 Pain in right ankle and joints of right foot: Secondary | ICD-10-CM

## 2014-08-03 MED ORDER — IOHEXOL 300 MG/ML  SOLN
100.0000 mL | Freq: Once | INTRAMUSCULAR | Status: AC | PRN
  Administered 2014-08-03: 100 mL via INTRAVENOUS

## 2014-08-15 ENCOUNTER — Encounter: Payer: Self-pay | Admitting: Cardiovascular Disease

## 2014-08-18 ENCOUNTER — Other Ambulatory Visit: Payer: Self-pay | Admitting: Cardiovascular Disease

## 2014-08-20 NOTE — Telephone Encounter (Signed)
Rx refill denied to patient pharmacy, referred to PCP

## 2014-08-21 ENCOUNTER — Other Ambulatory Visit: Payer: Self-pay | Admitting: *Deleted

## 2014-08-21 ENCOUNTER — Other Ambulatory Visit: Payer: Self-pay | Admitting: Cardiovascular Disease

## 2014-08-21 MED ORDER — METFORMIN HCL 500 MG PO TABS: 500.0000 mg | ORAL_TABLET | Freq: Two times a day (BID) | ORAL | Status: DC

## 2014-09-07 ENCOUNTER — Emergency Department (HOSPITAL_COMMUNITY)
Admission: EM | Admit: 2014-09-07 | Discharge: 2014-09-07 | Disposition: A | Discharge status: Home / Self Care | Payer: Medicare Other | Attending: Emergency Medicine | Admitting: Emergency Medicine

## 2014-09-07 ENCOUNTER — Encounter (HOSPITAL_COMMUNITY): Payer: Self-pay | Admitting: Emergency Medicine

## 2014-09-07 DIAGNOSIS — E119 Type 2 diabetes mellitus without complications: Secondary | ICD-10-CM | POA: Diagnosis not present

## 2014-09-07 DIAGNOSIS — Z87891 Personal history of nicotine dependence: Secondary | ICD-10-CM | POA: Diagnosis not present

## 2014-09-07 DIAGNOSIS — I509 Heart failure, unspecified: Secondary | ICD-10-CM | POA: Diagnosis not present

## 2014-09-07 DIAGNOSIS — Z9911 Dependence on respirator [ventilator] status: Secondary | ICD-10-CM | POA: Diagnosis not present

## 2014-09-07 DIAGNOSIS — Z950 Presence of cardiac pacemaker: Secondary | ICD-10-CM | POA: Insufficient documentation

## 2014-09-07 DIAGNOSIS — Z9981 Dependence on supplemental oxygen: Secondary | ICD-10-CM | POA: Insufficient documentation

## 2014-09-07 DIAGNOSIS — Z79899 Other long term (current) drug therapy: Secondary | ICD-10-CM | POA: Diagnosis not present

## 2014-09-07 DIAGNOSIS — G4730 Sleep apnea, unspecified: Secondary | ICD-10-CM | POA: Insufficient documentation

## 2014-09-07 DIAGNOSIS — Z7982 Long term (current) use of aspirin: Secondary | ICD-10-CM | POA: Insufficient documentation

## 2014-09-07 DIAGNOSIS — R51 Headache: Secondary | ICD-10-CM | POA: Insufficient documentation

## 2014-09-07 DIAGNOSIS — M129 Arthropathy, unspecified: Secondary | ICD-10-CM | POA: Diagnosis not present

## 2014-09-07 DIAGNOSIS — G44019 Episodic cluster headache, not intractable: Secondary | ICD-10-CM | POA: Insufficient documentation

## 2014-09-07 DIAGNOSIS — I10 Essential (primary) hypertension: Secondary | ICD-10-CM | POA: Diagnosis not present

## 2014-09-07 LAB — I-STAT TROPONIN, ED: Troponin i, poc: 0 ng/mL (ref 0.00–0.08)

## 2014-09-07 LAB — I-STAT CHEM 8, ED
BUN: 14 mg/dL (ref 6–23)
CREATININE: 1.2 mg/dL (ref 0.50–1.35)
Calcium, Ion: 1.16 mmol/L (ref 1.12–1.23)
Chloride: 102 mEq/L (ref 96–112)
GLUCOSE: 111 mg/dL — AB (ref 70–99)
HCT: 42 % (ref 39.0–52.0)
HEMOGLOBIN: 14.3 g/dL (ref 13.0–17.0)
Potassium: 4.5 mEq/L (ref 3.7–5.3)
SODIUM: 139 meq/L (ref 137–147)
TCO2: 27 mmol/L (ref 0–100)

## 2014-09-07 MED ORDER — DIPHENHYDRAMINE HCL 25 MG PO CAPS
50.0000 mg | ORAL_CAPSULE | Freq: Once | ORAL | Status: AC
  Administered 2014-09-07: 50 mg via ORAL
  Filled 2014-09-07: qty 2

## 2014-09-07 MED ORDER — AMOXICILLIN 500 MG PO CAPS: 500.0000 mg | ORAL_CAPSULE | Freq: Three times a day (TID) | ORAL | Status: DC

## 2014-09-07 MED ORDER — METOCLOPRAMIDE HCL 10 MG PO TABS
10.0000 mg | ORAL_TABLET | Freq: Once | ORAL | Status: AC
  Administered 2014-09-07: 10 mg via ORAL
  Filled 2014-09-07: qty 1

## 2014-09-07 NOTE — ED Provider Notes (Signed)
CSN: [id number]     Arrival date & time 09/07/14  1622 History   First MD Initiated Contact with Patient 09/07/14 2010     Chief Complaint  Patient presents with  . Chest Pain  . Headache     (Consider location/radiation/quality/duration/timing/severity/associated sxs/prior Treatment) Patient is a 48 y.o. male presenting with headaches. The history is provided by the patient.  Headache Pain location:  L temporal Quality:  Sharp Onset quality:  Sudden Duration:  4 days Timing:  Intermittent Progression:  Unchanged Chronicity:  Recurrent (happens about twice a year) Similar to prior headaches: yes   Context: not activity and not exposure to bright light   Relieved by:  Nothing Worsened by:  Nothing tried Associated symptoms: eye pain (with tearing)   Associated symptoms: no cough, no diarrhea, no facial pain, no numbness, no URI and no vomiting     Past Medical History  Diagnosis Date  . Pacemaker   . CHF (congestive heart failure)   . Dysrhythmia   . Shortness of breath     with exertion  . Sleep apnea     cpap setting at 11   . Diabetes mellitus without complication     po meds  . GERD (gastroesophageal reflux disease)   . Arthritis   . Hypertension 11/24/11    ECHO-EF52%   Past Surgical History  Procedure Laterality Date  . Cardiac catheterization    . Right hand surgery       pins placed in hand due to accident   . Mass excision  10/21/2012    Procedure: EXCISION MASS;  Surgeon: Mcarthur Rossetti, MD;  Location: [facility];  Service: Orthopedics;  Laterality: Left;  Excision Left Ring Finger Mass  . Insert / replace / remove pacemaker    . Colonoscopy with propofol N/A 07/13/2014    Procedure: COLONOSCOPY WITH PROPOFOL;  Surgeon: Beryle Beams, MD;  Location: [facility];  Service: Endoscopy;  Laterality: N/A;  aicd   History reviewed. No pertinent family history. History  Substance Use Topics  . Smoking status: Former Smoker    Types: Cigarettes   Quit date: 05/28/2013  . Smokeless tobacco: Never Used     Comment: still smokes a cigar once and a while  . Alcohol Use: Yes     Comment: 2x per week     Review of Systems  Eyes: Positive for pain (with tearing).  Respiratory: Negative for cough.   Gastrointestinal: Negative for vomiting and diarrhea.  Neurological: Positive for headaches. Negative for numbness.  All other systems reviewed and are negative.     Allergies  Nitroglycerin  Home Medications   Prior to Admission medications   Medication Sig Start Date End Date Taking? Authorizing Provider  acetaminophen (TYLENOL) 325 MG tablet Take 325 mg by mouth every 6 (six) hours as needed for mild pain.   Yes Historical Provider, MD  aspirin 325 MG tablet Take 325 mg by mouth daily.   Yes Historical Provider, MD  carvedilol (COREG) 25 MG tablet Take 50 mg by mouth 2 (two) times daily with a meal.   Yes Historical Provider, MD  cholecalciferol (VITAMIN D) 1000 UNITS tablet Take 1,000 Units by mouth daily.   Yes Historical Provider, MD  digoxin (LANOXIN) 0.25 MG tablet Take 0.25 mg by mouth every morning.   Yes Historical Provider, MD  furosemide (LASIX) 40 MG tablet Take 40 mg by mouth every morning.   Yes Historical Provider, MD  ibuprofen (ADVIL,MOTRIN) 800 MG tablet Take  800 mg by mouth every 6 (six) hours as needed for moderate pain.  11/07/13  Yes Historical Provider, MD  metFORMIN (GLUCOPHAGE) 500 MG tablet Take 1 tablet (500 mg total) by mouth 2 (two) times daily with a meal. 08/21/14  Yes Troy Sine, MD  potassium chloride (K-DUR,KLOR-CON) 10 MEQ tablet Take 10 mEq by mouth daily.   Yes Historical Provider, MD  rosuvastatin (CRESTOR) 20 MG tablet Take 20 mg by mouth every morning.   Yes Historical Provider, MD  valsartan (DIOVAN) 160 MG tablet Take 160 mg by mouth daily.   Yes Historical Provider, MD  UNABLE TO FIND CPAP THERAPHY    Historical Provider, MD   BP 111/67  Pulse 50  Temp(Src) 98.6 F (37 C) (Oral)   Resp 16  SpO2 100% Physical Exam  Nursing note and vitals reviewed. Constitutional: He is oriented to person, place, and time. He appears well-developed and well-nourished. No distress.  HENT:  Head: Normocephalic and atraumatic.  Mouth/Throat: No oropharyngeal exudate.  Eyes: EOM are normal. Pupils are equal, round, and reactive to light.  Neck: Normal range of motion. Neck supple.  Cardiovascular: Normal rate and regular rhythm.  Exam reveals no friction rub.   No murmur heard. Pulmonary/Chest: Effort normal and breath sounds normal. No respiratory distress. He has no wheezes. He has no rales.  Abdominal: He exhibits no distension. There is no tenderness. There is no rebound.  Musculoskeletal: Normal range of motion. He exhibits no edema.  Neurological: He is alert and oriented to person, place, and time.  Skin: He is not diaphoretic.    ED Course  Procedures (including critical care time) [place], ED  I-STAT CHEM 8, ED    Imaging Review No results found.   EKG Interpretation None      MDM   Final diagnoses:  Episodic cluster headache, not intractable    48 year old male here with headaches and chest pain. Has these episodes of headaches about twice a year, describes as sharp intense pain behind her right eye with tearing and occasional rhinorrhea. He also has right-sided headache behind that eye. No vision changes. No fevers. He's had some recent allergy symptoms which he thinks is causing the headache. He has taken a lot of Flonase and Claritin without relief. Patient also described episode of heavy-like chest pain lasted 15 minutes with associated diaphoresis, nausea, vomiting yesterday. He thinks it was related to the Flonase. He does have history of CHF and is on digoxin. Here vitals stable, neuro exam benign. I think his headaches are related to cluster headaches. Patient put on oxygen also given Reglan and Benadryl. Will check  troponin as his chest pain was yesterday and has not had any recurrence. Troponin normal, headache improving with oxygen. Patient states he had relief with antibiotics previously as this has helped before as this was related to sinus disease, so I gave him Amoxicillin. I did not want to start him on verapamil for his cluster headaches since he's on multiple meds for his CHF.  Evelina Bucy, MD 09/07/14 [phone]

## 2014-09-07 NOTE — Discharge Instructions (Signed)
Cluster Headache Cluster headaches are recognized by their pattern of deep, intense head pain. They normally occur on one side of your head, but they may "switch sides" in subsequent episodes. Typically, cluster headaches:   Are severe in nature.   Occur repeatedly over weeks to months and are followed by periods of no headaches.   Can last from 15 minutes to 3 hours.   Occur at the same time each day, often at night.   Occur several times a day. CAUSES The exact cause of cluster headaches is not known. Alcohol use may be associated with cluster headaches. SIGNS AND SYMPTOMS   Severe pain that begins in or around your eye or temple.   One-sided head pain.   Feeling sick to your stomach (nauseous).   Sensitivity to light.   Runny nose.   Eye redness, tearing, and nasal stuffiness on the side of your head where you are experiencing pain.   Sweaty, pale skin of the face.   Droopy or swollen eyelid.   Restlessness. DIAGNOSIS  Cluster headaches are diagnosed based on symptoms and a physical exam. Your health care provider may order a CT scan or an MRI of your head or lab tests to see if your headaches are caused by other medical conditions.  TREATMENT   Medicines for pain relief and to prevent recurrent attacks. Some people may need a combination of medicines.  Oxygen for pain relief.   Biofeedback programs to help reduce headache pain.  It may be helpful to keep a headache diary. This may help you find a trend for what is triggering your headaches. Your health care provider can develop a treatment plan.  HOME CARE INSTRUCTIONS  During cluster periods:   Follow a regular sleep schedule. Do not vary the amount and time that you sleep from day to day. It is important to stay on the same schedule during a cluster period to help prevent headaches.   Avoid alcohol.   Stop smoking if you smoke.  SEEK MEDICAL CARE IF:  You have any changes from your previous  cluster headaches either in intensity or frequency.   You are not getting relief from medicines you are taking.  SEEK IMMEDIATE MEDICAL CARE IF:   You faint.   You have weakness or numbness, especially on one side of your body or face.   You have double vision.   You have nausea or vomiting that is not relieved within several hours.   You cannot keep your balance or have difficulty talking or walking.   You have neck pain or stiffness.   You have a fever. MAKE SURE YOU:  Understand these instructions.   Will watch your condition.   Will get help right away if you are not doing well or get worse. Document Released: 12/14/2005 Document Revised: 10/04/2013 Document Reviewed: 07/06/2013 [employer] Patient Information 2015 [employer]. This information is not intended to replace advice given to you by your health care provider. Make sure you discuss any questions you have with your health care provider.  

## 2014-09-07 NOTE — ED Notes (Signed)
Pt has had headaches for 4 days.  Pt went to MD yesterday.  Pt has been taking multiple advil causing chest to hurt.  Pt continues to have headache.

## 2014-09-13 ENCOUNTER — Telehealth: Payer: Self-pay | Admitting: *Deleted

## 2014-09-13 NOTE — Telephone Encounter (Signed)
Notification from [place] reviewed by Dr. Loletha Grayer.  Patient has an appt 10/16/14.  Report faxed to [facility] at [facility].

## 2014-10-16 ENCOUNTER — Encounter: Payer: Medicare Other | Admitting: Cardiovascular Disease

## 2014-10-29 ENCOUNTER — Other Ambulatory Visit: Payer: Self-pay | Admitting: Cardiology

## 2014-10-29 NOTE — Telephone Encounter (Signed)
Rx was sent to pharmacy electronically. 

## 2014-10-31 ENCOUNTER — Telehealth: Payer: Self-pay | Admitting: Cardiovascular Disease

## 2014-11-05 NOTE — Telephone Encounter (Signed)
Closed encounter °

## 2014-12-11 ENCOUNTER — Encounter: Payer: Medicare Other | Admitting: Cardiovascular Disease

## 2014-12-24 ENCOUNTER — Ambulatory Visit: Payer: Medicare Other | Admitting: Cardiovascular Disease

## 2015-01-03 ENCOUNTER — Emergency Department (HOSPITAL_COMMUNITY)
Admission: EM | Admit: 2015-01-03 | Discharge: 2015-01-03 | Disposition: A | Discharge status: Home / Self Care | Payer: Medicare Other | Attending: Emergency Medicine | Admitting: Emergency Medicine

## 2015-01-03 ENCOUNTER — Encounter (HOSPITAL_COMMUNITY): Payer: Self-pay | Admitting: *Deleted

## 2015-01-03 DIAGNOSIS — Z7982 Long term (current) use of aspirin: Secondary | ICD-10-CM | POA: Insufficient documentation

## 2015-01-03 DIAGNOSIS — Z950 Presence of cardiac pacemaker: Secondary | ICD-10-CM | POA: Diagnosis not present

## 2015-01-03 DIAGNOSIS — Z9981 Dependence on supplemental oxygen: Secondary | ICD-10-CM | POA: Insufficient documentation

## 2015-01-03 DIAGNOSIS — Z87891 Personal history of nicotine dependence: Secondary | ICD-10-CM | POA: Insufficient documentation

## 2015-01-03 DIAGNOSIS — M1990 Unspecified osteoarthritis, unspecified site: Secondary | ICD-10-CM | POA: Diagnosis not present

## 2015-01-03 DIAGNOSIS — I509 Heart failure, unspecified: Secondary | ICD-10-CM | POA: Insufficient documentation

## 2015-01-03 DIAGNOSIS — M109 Gout, unspecified: Secondary | ICD-10-CM | POA: Diagnosis present

## 2015-01-03 DIAGNOSIS — Z79899 Other long term (current) drug therapy: Secondary | ICD-10-CM | POA: Insufficient documentation

## 2015-01-03 DIAGNOSIS — Z8669 Personal history of other diseases of the nervous system and sense organs: Secondary | ICD-10-CM | POA: Insufficient documentation

## 2015-01-03 DIAGNOSIS — E119 Type 2 diabetes mellitus without complications: Secondary | ICD-10-CM | POA: Diagnosis not present

## 2015-01-03 DIAGNOSIS — I10 Essential (primary) hypertension: Secondary | ICD-10-CM | POA: Diagnosis not present

## 2015-01-03 DIAGNOSIS — K219 Gastro-esophageal reflux disease without esophagitis: Secondary | ICD-10-CM | POA: Insufficient documentation

## 2015-01-03 MED ORDER — LIDOCAINE-EPINEPHRINE 1 %-1:100000 IJ SOLN
INTRAMUSCULAR | Status: AC
  Administered 2015-01-03: 11:00:00
  Filled 2015-01-03: qty 1

## 2015-01-03 MED ORDER — OXYCODONE-ACETAMINOPHEN 5-325 MG PO TABS: 1.0000 | ORAL_TABLET | Freq: Four times a day (QID) | ORAL | Status: DC | PRN

## 2015-01-03 MED ORDER — OXYCODONE-ACETAMINOPHEN 5-325 MG PO TABS
2.0000 | ORAL_TABLET | Freq: Once | ORAL | Status: AC
  Administered 2015-01-03: 2 via ORAL
  Filled 2015-01-03: qty 2

## 2015-01-03 MED ORDER — COLCHICINE 0.6 MG PO TABS: 0.6000 mg | ORAL_TABLET | Freq: Every day | ORAL | Status: DC

## 2015-01-03 MED ORDER — PREDNISONE 20 MG PO TABS
60.0000 mg | ORAL_TABLET | Freq: Once | ORAL | Status: AC
  Administered 2015-01-03: 60 mg via ORAL
  Filled 2015-01-03: qty 3

## 2015-01-03 MED ORDER — PREDNISONE 20 MG PO TABS: ORAL_TABLET | ORAL | Status: DC

## 2015-01-03 MED ORDER — COLCHICINE 0.6 MG PO TABS
1.2000 mg | ORAL_TABLET | Freq: Once | ORAL | Status: AC
  Administered 2015-01-03: 1.2 mg via ORAL
  Filled 2015-01-03 (×2): qty 2

## 2015-01-03 NOTE — ED Provider Notes (Signed)
CSN: [id number]     Arrival date & time 01/03/15  7026 History   First MD Initiated Contact with Patient 01/03/15 1008     Chief Complaint  Patient presents with  . Gout     (Consider location/radiation/quality/duration/timing/severity/associated sxs/prior Treatment) HPI Comments: Patient presents with 5 days of painful swelling of left knee and left wrist with warmth.  He initially also had some pain in his bilateral feet, which has since resolved.  He reports history of same in the left knee has never had swelling in her left wrist in the past.  He has a history of gout.  He states he has had some episodes of sweating at home.  It has had no known fevers.  He is otherwise been well   The history is provided by the patient. No language interpreter was used.    Past Medical History  Diagnosis Date  . Pacemaker   . CHF (congestive heart failure)   . Dysrhythmia   . Shortness of breath     with exertion  . Sleep apnea     cpap setting at 9   . Diabetes mellitus without complication     po meds  . GERD (gastroesophageal reflux disease)   . Arthritis   . Hypertension 11/24/11    ECHO-EF52%   Past Surgical History  Procedure Laterality Date  . Cardiac catheterization    . Right hand surgery       pins placed in hand due to accident   . Mass excision  10/21/2012    Procedure: EXCISION MASS;  Surgeon: Mcarthur Rossetti, MD;  Location: [facility];  Service: Orthopedics;  Laterality: Left;  Excision Left Ring Finger Mass  . Insert / replace / remove pacemaker    . Colonoscopy with propofol N/A 07/13/2014    Procedure: COLONOSCOPY WITH PROPOFOL;  Surgeon: Beryle Beams, MD;  Location: [facility] END[facility]COPY;  Service: Endoscopy;  Laterality: N/A;  aicd   No family history on file. History  Substance Use Topics  . Smoking status: Former Smoker    Types: Cigarettes    Quit date: 05/28/2013  . Smokeless tobacco: Never Used     Comment: still smokes a cigar once and a while  . Alcohol Use:  Yes     Comment: 2x per week     Review of Systems  Constitutional: Negative for fever, activity change, appetite change and fatigue.  HENT: Negative for congestion, facial swelling, rhinorrhea and trouble swallowing.   Eyes: Negative for photophobia and pain.  Respiratory: Negative for cough, chest tightness and shortness of breath.   Cardiovascular: Negative for chest pain and leg swelling.  Gastrointestinal: Negative for nausea, vomiting, abdominal pain, diarrhea and constipation.  Endocrine: Negative for polydipsia and polyuria.  Genitourinary: Negative for dysuria, urgency, decreased urine volume and difficulty urinating.  Musculoskeletal: Negative for back pain and gait problem.  Skin: Negative for color change, rash and wound.  Allergic/Immunologic: Negative for immunocompromised state.  Neurological: Negative for dizziness, facial asymmetry, speech difficulty, weakness, numbness and headaches.  Psychiatric/Behavioral: Negative for confusion, decreased concentration and agitation.      Allergies  Nitroglycerin  Home Medications   Prior to Admission medications   Medication Sig Start Date End Date Taking? Authorizing Provider  acetaminophen (TYLENOL) 325 MG tablet Take 325 mg by mouth every 6 (six) hours as needed for mild pain.   Yes Historical Provider, MD  aspirin 325 MG tablet Take 325 mg by mouth daily.   Yes Historical Provider,  MD  carvedilol (COREG) 25 MG tablet Take 50 mg by mouth 2 (two) times daily with a meal.   Yes Historical Provider, MD  cholecalciferol (VITAMIN D) 1000 UNITS tablet Take 1,000 Units by mouth daily.   Yes Historical Provider, MD  digoxin (LANOXIN) 0.25 MG tablet Take 0.25 mg by mouth every morning.   Yes Historical Provider, MD  furosemide (LASIX) 40 MG tablet Take 40 mg by mouth every morning.   Yes Historical Provider, MD  ibuprofen (ADVIL,MOTRIN) 800 MG tablet Take 800 mg by mouth every 6 (six) hours as needed for moderate pain.  11/07/13   Yes Historical Provider, MD  metFORMIN (GLUCOPHAGE) 500 MG tablet Take 1 tablet (500 mg total) by mouth 2 (two) times daily with a meal. 08/21/14  Yes Troy Sine, MD  potassium chloride (K-DUR,KLOR-CON) 10 MEQ tablet Take 10 mEq by mouth daily.   Yes Historical Provider, MD  rosuvastatin (CRESTOR) 20 MG tablet Take 20 mg by mouth every morning.   Yes Historical Provider, MD  spironolactone (ALDACTONE) 25 MG tablet Take 25 mg by mouth 2 (two) times daily.   Yes Historical Provider, MD  UNABLE TO FIND CPAP THERAPHY   Yes Historical Provider, MD  valsartan (DIOVAN) 160 MG tablet Take 160 mg by mouth daily.   Yes Historical Provider, MD  amoxicillin (AMOXIL) 500 MG capsule Take 1 capsule (500 mg total) by mouth 3 (three) times daily. Patient not taking: Reported on 01/03/2015 09/07/14   Evelina Bucy, MD  colchicine 0.6 MG tablet Take 1 tablet (0.6 mg total) by mouth daily. Take 2 tabs at sign of acute gout flare, then 0.6mg  an hour later, followed by twice daily 01/03/15   Ernestina Patches, MD  omeprazole (PRILOSEC) 20 MG capsule Take 1 capsule (20 mg total) by mouth daily. Patient not taking: Reported on 01/03/2015 10/29/14   Troy Sine, MD  oxyCODONE-acetaminophen (PERCOCET) 5-325 MG per tablet Take 1 tablet by mouth every 6 (six) hours as needed. 01/03/15   Ernestina Patches, MD  predniSONE (DELTASONE) 20 MG tablet 3 tabs po day one, then 2 po daily x 4 days 01/03/15   Ernestina Patches, MD   BP 176/92 mmHg  Pulse 85  Temp(Src) 98.3 F (36.8 C) (Oral)  Resp 18  SpO2 100% Physical Exam  Constitutional: He is oriented to person, place, and time. He appears well-developed and well-nourished. No distress.  HENT:  Head: Normocephalic and atraumatic.  Mouth/Throat: No oropharyngeal exudate.  Eyes: Pupils are equal, round, and reactive to light.  Neck: Normal range of motion. Neck supple.  Cardiovascular: Normal rate, regular rhythm and normal heart sounds.  Exam reveals no gallop and no friction rub.   No  murmur heard. Pulmonary/Chest: Effort normal and breath sounds normal. No respiratory distress. He has no wheezes. He has no rales.  Abdominal: Soft. Bowel sounds are normal. He exhibits no distension and no mass. There is no tenderness. There is no rebound and no guarding.  Musculoskeletal: He exhibits no edema or tenderness.       Left wrist: He exhibits decreased range of motion, swelling and effusion.       Left knee: He exhibits decreased range of motion, effusion and erythema.  Neurological: He is alert and oriented to person, place, and time.  Skin: Skin is warm and dry.  Psychiatric: He has a normal mood and affect.    ED Course  Procedures (including critical care time) Labs Review Labs Reviewed - No data to display  Imaging Review No results found.   EKG Interpretation None      MDM   Final diagnoses:  Gout of multiple sites, unspecified cause, unspecified chronicity    Pt is a 49 y.o. male with Pmhx as above who presents with 5 days of pain, swelling in L knee and L wrist.  He initially also had pain in his bilateral feet, which has since resolved.  He has prior history of gout and has had gout in the left knee in the past and has never had it in his left wrist.  He states he has had some episodes of sweating, has had no known fevers.  He is afebrile.  On physical exam here.  He has a small to moderate left knee effusion with associated warmth.  He also has a small left wrist effusion.  I believe exam is consistent with gout.  Made an attempt at left knee arthrocentesis without success.  I doubt septi polyarthritis, given his lack of fever and well appearance on physical exam.  Patient will be started on course of prednisone, colchicine, will be given except for breakthrough pain.  He has an appointment scheduled with his PCP tomorrow and I encouraged him to keep that appointment.  Conley Simmonds evaluation in the Emergency Department is complete. It has been determined that no  acute conditions requiring further emergency intervention are present at this time. The patient/guardian have been advised of the diagnosis and plan. We have discussed signs and symptoms that warrant return to the ED, such as changes or worsening in symptoms, worsening swelling, redness, development of fevers      Ernestina Patches, MD 01/03/15 1128

## 2015-01-03 NOTE — Discharge Instructions (Signed)
Gout Gout is when your joints become red, sore, and swell (inflamed). This is caused by the buildup of uric acid crystals in the joints. Uric acid is a chemical that is normally in the blood. If the level of uric acid gets too high in the blood, these crystals form in your joints and tissues. Over time, these crystals can form into masses near the joints and tissues. These masses can destroy bone and cause the bone to look misshapen (deformed). HOME CARE   Do not take aspirin for pain.  Only take medicine as told by your doctor.  Rest the joint as much as you can. When in bed, keep sheets and blankets off painful areas.  Keep the sore joints raised (elevated).  Put warm or cold packs on painful joints. Use of warm or cold packs depends on which works best for you.  Use crutches if the painful joint is in your leg.  Drink enough fluids to keep your pee (urine) clear or pale yellow. Limit alcohol, sugary drinks, and drinks with fructose in them.  Follow your diet instructions. Pay careful attention to how much protein you eat. Include fruits, vegetables, whole grains, and fat-free or low-fat milk products in your daily diet. Talk to your doctor or dietitian about the use of coffee, vitamin C, and cherries. These may help lower uric acid levels.  Keep a healthy body weight. GET HELP RIGHT AWAY IF:   You have watery poop (diarrhea), throw up (vomit), or have any side effects from medicines.  You do not feel better in 24 hours, or you are getting worse.  Your joint becomes suddenly more tender, and you have chills or a fever. MAKE SURE YOU:   Understand these instructions.  Will watch your condition.  Will get help right away if you are not doing well or get worse. Document Released: 09/22/2008 Document Revised: 04/30/2014 Document Reviewed: 07/27/2012 [facility] Patient Information 2015 [place], [place]. This information is not intended to replace advice given to you by your health care  provider. Make sure you discuss any questions you have with your health care provider.  Low-Purine Diet Purines are compounds that affect the level of uric acid in your body. A low-purine diet is a diet that is low in purines. Eating a low-purine diet can prevent the level of uric acid in your body from getting too high and causing gout or kidney stones or both. WHAT DO I NEED TO KNOW ABOUT THIS DIET?  Choose low-purine foods. Examples of low-purine foods are listed in the next section.  Drink plenty of fluids, especially water. Fluids can help remove uric acid from your body. Try to drink 8-16 cups (1.9-3.8 L) a day.  Limit foods high in fat, especially saturated fat, as fat makes it harder for the body to get rid of uric acid. Foods high in saturated fat include pizza, cheese, ice cream, whole milk, fried foods, and gravies. Choose foods that are lower in fat and lean sources of protein. Use olive oil when cooking as it contains healthy fats that are not high in saturated fat.  Limit alcohol. Alcohol interferes with the elimination of uric acid from your body. If you are having a gout attack, avoid all alcohol.  Keep in mind that different people's bodies react differently to different foods. You will probably learn over time which foods do or do not affect you. If you discover that a food tends to cause your gout to flare up, avoid eating that  food. You can more freely enjoy foods that do not cause problems. If you have any questions about a food item, talk to your dietitian or health care provider. WHICH FOODS ARE LOW, MODERATE, AND HIGH IN PURINES? The following is a list of foods that are low, moderate, and high in purines. You can eat any amount of the foods that are low in purines. You may be able to have small amounts of foods that are moderate in purines. Ask your health care provider how much of a food moderate in purines you can have. Avoid foods high in purines. Grains  Foods low in  purines: Enriched white bread, pasta, rice, cake, cornbread, popcorn.  Foods moderate in purines: Whole-grain breads and cereals, wheat germ, bran, oatmeal. Uncooked oatmeal. Dry wheat bran or wheat germ.  Foods high in purines: Pancakes, [place] toast, biscuits, muffins. Vegetables  Foods low in purines: All vegetables, except those that are moderate in purines.  Foods moderate in purines: Asparagus, cauliflower, spinach, mushrooms, green peas. Fruits  All fruits are low in purines. Meats and other Protein Foods  Foods low in purines: Eggs, nuts, peanut butter.  Foods moderate in purines: 80-90% lean beef, lamb, veal, pork, poultry, fish, eggs, peanut butter, nuts. Crab, lobster, oysters, and shrimp. Cooked dried beans, peas, and lentils.  Foods high in purines: Anchovies, sardines, herring, mussels, tuna, codfish, scallops, trout, and haddock. [name]. Organ meats (such as liver or kidney). [place]pe. Game meat. Goose. Sweetbreads. Dairy  All dairy foods are low in purines. Low-fat and fat-free dairy products are best because they are low in saturated fat. Beverages  Drinks low in purines: Water, carbonated beverages, tea, coffee, cocoa.  Drinks moderate in purines: Soft drinks and other drinks sweetened with high-fructose corn syrup. Juices. To find whether a food or drink is sweetened with high-fructose corn syrup, look at the ingredients list.  Drinks high in purines: Alcoholic beverages (such as beer). Condiments  Foods low in purines: Salt, herbs, olives, pickles, relishes, vinegar.  Foods moderate in purines: Butter, margarine, oils, mayonnaise. Fats and Oils  Foods low in purines: All types, except gravies and sauces made with meat.  Foods high in purines: Gravies and sauces made with meat. Other Foods  Foods low in purines: Sugars, sweets, gelatin. Cake. Soups made without meat.  Foods moderate in purines: Meat-based or fish-based soups, broths, or bouillons. Foods and  drinks sweetened with high-fructose corn syrup.  Foods high in purines: High-fat desserts (such as ice cream, cookies, cakes, pies, doughnuts, and chocolate). Contact your dietitian for more information on foods that are not listed here. Document Released: 04/10/2011 Document Revised: 12/19/2013 Document Reviewed: 11/20/2013 [facility] Patient Information 2015 [place], [place]. This information is not intended to replace advice given to you by your health care provider. Make sure you discuss any questions you have with your health care provider.

## 2015-01-03 NOTE — ED Notes (Signed)
Bed: WA21 Expected date:  Expected time:  Means of arrival:  Comments: Ems- knee pain gout

## 2015-01-03 NOTE — ED Notes (Signed)
Per EMS pt coming from home with c/o gout flare up in his left wrist and left knee x 3 days, hx of same.

## 2015-01-09 ENCOUNTER — Other Ambulatory Visit: Payer: Self-pay | Admitting: Cardiovascular Disease

## 2015-01-09 NOTE — Telephone Encounter (Signed)
Rx(s) sent to pharmacy electronically.  

## 2015-01-09 NOTE — Telephone Encounter (Signed)
Rx(s) sent to pharmacy electronically. OV 01/28/2015

## 2015-01-28 ENCOUNTER — Ambulatory Visit: Payer: Medicare Other | Admitting: Cardiovascular Disease

## 2015-01-31 ENCOUNTER — Other Ambulatory Visit: Payer: Self-pay | Admitting: Cardiovascular Disease

## 2015-01-31 NOTE — Telephone Encounter (Signed)
Rx(s) sent to pharmacy electronically. OV 02/19/15

## 2015-02-19 ENCOUNTER — Ambulatory Visit (INDEPENDENT_AMBULATORY_CARE_PROVIDER_SITE_OTHER): Payer: Medicare Other | Admitting: Cardiovascular Disease

## 2015-02-19 ENCOUNTER — Encounter: Payer: Self-pay | Admitting: Cardiovascular Disease

## 2015-02-19 VITALS — BP 132/90 | HR 62 | Resp 16 | Ht 68.0 in | Wt 202.7 lb

## 2015-02-19 DIAGNOSIS — Z95810 Presence of automatic (implantable) cardiac defibrillator: Secondary | ICD-10-CM

## 2015-02-19 DIAGNOSIS — I472 Ventricular tachycardia, unspecified: Secondary | ICD-10-CM

## 2015-02-19 DIAGNOSIS — I2510 Atherosclerotic heart disease of native coronary artery without angina pectoris: Secondary | ICD-10-CM

## 2015-02-19 DIAGNOSIS — I429 Cardiomyopathy, unspecified: Secondary | ICD-10-CM

## 2015-02-19 DIAGNOSIS — E669 Obesity, unspecified: Secondary | ICD-10-CM

## 2015-02-19 DIAGNOSIS — I10 Essential (primary) hypertension: Secondary | ICD-10-CM

## 2015-02-19 DIAGNOSIS — I428 Other cardiomyopathies: Secondary | ICD-10-CM

## 2015-02-19 LAB — MDC_IDC_ENUM_SESS_TYPE_INCLINIC
Battery Remaining Percentage: 41 %
Brady Statistic AP VS Percent: 9 %
Brady Statistic AS VP Percent: 0 %
Brady Statistic AS VS Percent: 91 %
HIGH POWER IMPEDANCE MEASURED VALUE: 50 Ohm
Implantable Pulse Generator Model: 540
Lead Channel Impedance Value: 485 Ohm
Lead Channel Impedance Value: 567 Ohm
Lead Channel Pacing Threshold Amplitude: 0.7 V
Lead Channel Pacing Threshold Pulse Width: 0.4 ms
Lead Channel Sensing Intrinsic Amplitude: 1 mV
Lead Channel Sensing Intrinsic Amplitude: 5.8 mV
Lead Channel Setting Pacing Amplitude: 2 V
Lead Channel Setting Pacing Pulse Width: 0.4 ms
MDC IDC MSMT LEADCHNL RV PACING THRESHOLD AMPLITUDE: 0.7 V
MDC IDC MSMT LEADCHNL RV PACING THRESHOLD PULSEWIDTH: 0.4 ms
MDC IDC PG SERIAL: 60474038
MDC IDC SET LEADCHNL RV PACING AMPLITUDE: 2 V
MDC IDC SET ZONE DETECTION INTERVAL: 300 ms
MDC IDC SET ZONE DETECTION INTERVAL: 340.9 ms
MDC IDC STAT BRADY AP VP PERCENT: 0 %

## 2015-02-19 MED ORDER — DIGOXIN 250 MCG PO TABS: 0.1250 mg | ORAL_TABLET | Freq: Every day | ORAL | Status: DC

## 2015-02-19 NOTE — Patient Instructions (Signed)
Remote monitoring is used to monitor your pacemaker from home. This monitoring reduces the number of office visits required to check your device to one time per year. It allows [place] to keep an eye on the functioning of your device to ensure it is working properly. You are scheduled for a device check from home on 05-21-2015. You may send your transmission at any time that day. If you have a wireless device, the transmission will be sent automatically. After your physician reviews your transmission, you will receive a postcard with your next transmission date.  Your physician recommends that you schedule a follow-up appointment in: 12 months with Dr.Croitoru  Your physician has recommended you make the following change in your medication: Decrease digoxin to 0.125mg  daily (1/2 tablet)

## 2015-02-21 ENCOUNTER — Encounter: Payer: Self-pay | Admitting: Cardiovascular Disease

## 2015-02-21 NOTE — Progress Notes (Signed)
Patient ID: [name], male   DOB: [birth year], 49 y.o.   MRN: [id number]      Reason for office visit ICD check, VT  [name] is now 49 years old and returns for a defibrillator check. He has a long-standing history of nonischemic cardiomyopathy (minor coronary artery stenoses by cardiac catheterizations in 1998 & 2005, at one point LVEF 20%, most recent left ventricular ejection fraction improved to 50%) and a history of recurrent ventricular tachycardia. He has obstructive sleep apnea and hyperlipidemia. He has a Bi[employer] defibrillator that has repeatedly provided antitachycardia pacing in the past (4 times in 2012, once in 2013, most recently once in February 2015). He has not received ICD shocks.  He is mildly obese with a BMI just over 30, has borderline diabetes mellitus with a most recent hemoglobin A1c of 5.8% and mixed hyperlipidemia with predominant hypertriglyceridemia.  He has no cardiac complaints today. He describes an unpleasant metallic taste in his mouth, present for the last several months.  Interrogation of his defibrillator today shows normal device function without any recent episodes of ventricular tachycardia or any need for pacing    Allergies  Allergen Reactions  . Nitroglycerin Hives    Sub lingual broke him out in hives 2009    Current Outpatient Prescriptions  Medication Sig Dispense Refill  . acetaminophen (TYLENOL) 325 MG tablet Take 325 mg by mouth every 6 (six) hours as needed for mild pain.    Marland Kitchen aspirin 325 MG tablet Take 325 mg by mouth daily.    . carvedilol (COREG) 25 MG tablet Take 50 mg by mouth 2 (two) times daily with a meal.    . cholecalciferol (VITAMIN D) 1000 UNITS tablet Take 1,000 Units by mouth daily.    . colchicine 0.6 MG tablet Take 1 tablet (0.6 mg total) by mouth daily. Take 2 tabs at sign of acute gout flare, then 0.6mg  an hour later, followed by twice daily 20 tablet 0  . furosemide (LASIX) 40 MG tablet Take 40 mg by mouth every  morning.    Marland Kitchen ibuprofen (ADVIL,MOTRIN) 800 MG tablet Take 800 mg by mouth every 6 (six) hours as needed for moderate pain.     . metFORMIN (GLUCOPHAGE) 500 MG tablet Take 1 tablet (500 mg total) by mouth 2 (two) times daily with a meal. 60 tablet 4  . omeprazole (PRILOSEC) 20 MG capsule TAKE 1 CAPSULE BY MOUTH DAILY 90 capsule 0  . oxyCODONE-acetaminophen (PERCOCET) 5-325 MG per tablet Take 1 tablet by mouth every 6 (six) hours as needed. 20 tablet 0  . potassium chloride (K-DUR,KLOR-CON) 10 MEQ tablet Take 10 mEq by mouth daily.    . rosuvastatin (CRESTOR) 20 MG tablet Take 20 mg by mouth every morning.    Marland Kitchen spironolactone (ALDACTONE) 25 MG tablet Take 25 mg by mouth 2 (two) times daily.    Marland Kitchen topiramate (TOPAMAX) 25 MG tablet Take 3 tablets by mouth daily.  2  . UNABLE TO FIND CPAP THERAPHY    . valsartan (DIOVAN) 160 MG tablet Take 160 mg by mouth daily.    . digoxin (LANOXIN) 0.25 MG tablet Take 0.5 tablets (0.125 mg total) by mouth daily. 45 tablet 3   No current facility-administered medications for this visit.    Past Medical History  Diagnosis Date  . Pacemaker   . CHF (congestive heart failure)   . Dysrhythmia   . Shortness of breath     with exertion  . Sleep apnea  cpap setting at 11   . Diabetes mellitus without complication     po meds  . GERD (gastroesophageal reflux disease)   . Arthritis   . Hypertension 11/24/11    ECHO-EF52%    Past Surgical History  Procedure Laterality Date  . Cardiac catheterization    . Right hand surgery       pins placed in hand due to accident   . Mass excision  10/21/2012    Procedure: EXCISION MASS;  Surgeon: Mcarthur Rossetti, MD;  Location: [facility];  Service: Orthopedics;  Laterality: Left;  Excision Left Ring Finger Mass  . Insert / replace / remove pacemaker    . Colonoscopy with propofol N/A 07/13/2014    Procedure: COLONOSCOPY WITH PROPOFOL;  Surgeon: Beryle Beams, MD;  Location: [facility];  Service: Endoscopy;   Laterality: N/A;  aicd    No family history on file.  History   Social History  . Marital Status: Married    Spouse Name: N/A  . Number of Children: N/A  . Years of Education: N/A   Occupational History  . Not on file.   Social History Main Topics  . Smoking status: Former Smoker    Types: Cigarettes    Quit date: 05/28/2013  . Smokeless tobacco: Never Used     Comment: still smokes a cigar once and a while  . Alcohol Use: Yes     Comment: 2x per week   . Drug Use: No  . Sexual Activity: Not on file   Other Topics Concern  . Not on file   Social History Narrative    Review of systems: The patient specifically denies any chest pain at rest or with exertion, dyspnea at rest or with exertion, orthopnea, paroxysmal nocturnal dyspnea, syncope, palpitations, focal neurological deficits, intermittent claudication, lower extremity edema, unexplained weight gain, cough, hemoptysis or wheezing.  The patient also denies abdominal pain, nausea, vomiting, dysphagia, diarrhea, constipation, polyuria, polydipsia, dysuria, hematuria, frequency, urgency, abnormal bleeding or bruising, fever, chills, unexpected weight changes, mood swings, change in skin or hair texture, change in voice quality, auditory or visual problems, allergic reactions or rashes, new musculoskeletal complaints other than usual "aches and pains".   PHYSICAL EXAM BP 132/90 mmHg  Pulse 62  Resp 16  Ht 5\' 8"  (1.727 m)  Wt 202 lb 11.2 oz (91.944 kg)  BMI 30.83 kg/m2  General: Alert, oriented x3, no distress Head: no evidence of trauma, PERRL, EOMI, no exophtalmos or lid lag, no myxedema, no xanthelasma; normal ears, nose and oropharynx Neck: normal jugular venous pulsations and no hepatojugular reflux; brisk carotid pulses without delay and no carotid bruits Chest: clear to auscultation, no signs of consolidation by percussion or palpation, normal fremitus, symmetrical and full respiratory excursions, healthy  subclavian defibrillator site Cardiovascular: normal position and quality of the apical impulse, regular rhythm, normal first and second heart sounds, no murmurs, rubs or gallops Abdomen: no tenderness or distention, no masses by palpation, no abnormal pulsatility or arterial bruits, normal bowel sounds, no hepatosplenomegaly Extremities: no clubbing, cyanosis or edema; 2+ radial, ulnar and brachial pulses bilaterally; 2+ right femoral, posterior tibial and dorsalis pedis pulses; 2+ left femoral, posterior tibial and dorsalis pedis pulses; no subclavian or femoral bruits Neurological: grossly nonfocal   EKG: Normal sinus rhythm, nonspecific T-wave changes possible digoxin effect  Lipid Panel     Component Value Date/Time   CHOL 179 01/17/2014 0941   TRIG 125 01/17/2014 0941   HDL 64 01/17/2014 0941  CHOLHDL 2.8 01/17/2014 0941   VLDL 25 01/17/2014 0941   LDLCALC 90 01/17/2014 0941   most recent lipid profile total cholesterol 154, triglycerides 308, HDL 47, LDL 77  BMET    Component Value Date/Time   NA 139 09/07/2014 2129   K 4.5 09/07/2014 2129   CL 102 09/07/2014 2129   CO2 25 01/17/2014 0941   GLUCOSE 111* 09/07/2014 2129   BUN 14 09/07/2014 2129   CREATININE 1.20 09/07/2014 2129   CREATININE 1.07 01/17/2014 0941   CALCIUM 8.9 01/17/2014 0941   GFRNONAA 83* 02/28/2013 0950   GFRAA >90 02/28/2013 0950   Recent digoxin level I.2  ASSESSMENT AND PLAN  Nonischemic cardiomyopathy with improved left ventricular systolic function NYHA functional class I on carvedilol and spironolactone and angiotensin receptor blocker. I don't think he requires digoxin and wonder whether this may be responsible for his taste disturbance. Asked him to reduce it to 0.125 mg daily and would strongly consider discontinuing it altogether.  Recurrent ventricular tachycardia No recent events recorded by his defibrillator  Normal defibrillator function   Orders Placed This Encounter  Procedures   . Implantable device check  . EKG 12-Lead   Meds ordered this encounter  Medications  . topiramate (TOPAMAX) 25 MG tablet    Sig: Take 3 tablets by mouth daily.    Refill:  2  . digoxin (LANOXIN) 0.25 MG tablet    Sig: Take 0.5 tablets (0.125 mg total) by mouth daily.    Dispense:  45 tablet    Refill:  Honomu Celestial Barnfield, MD, [facility] [phone] office [phone] pager

## 2015-02-26 ENCOUNTER — Encounter (HOSPITAL_BASED_OUTPATIENT_CLINIC_OR_DEPARTMENT_OTHER): Payer: Self-pay | Admitting: *Deleted

## 2015-02-26 ENCOUNTER — Encounter: Payer: Self-pay | Admitting: Cardiovascular Disease

## 2015-02-26 ENCOUNTER — Emergency Department (HOSPITAL_BASED_OUTPATIENT_CLINIC_OR_DEPARTMENT_OTHER)
Admission: EM | Admit: 2015-02-26 | Discharge: 2015-02-26 | Disposition: A | Discharge status: Home / Self Care | Payer: Medicare Other | Attending: Emergency Medicine | Admitting: Emergency Medicine

## 2015-02-26 DIAGNOSIS — K219 Gastro-esophageal reflux disease without esophagitis: Secondary | ICD-10-CM | POA: Insufficient documentation

## 2015-02-26 DIAGNOSIS — Z9981 Dependence on supplemental oxygen: Secondary | ICD-10-CM | POA: Diagnosis not present

## 2015-02-26 DIAGNOSIS — Z950 Presence of cardiac pacemaker: Secondary | ICD-10-CM | POA: Diagnosis not present

## 2015-02-26 DIAGNOSIS — Z9889 Other specified postprocedural states: Secondary | ICD-10-CM | POA: Insufficient documentation

## 2015-02-26 DIAGNOSIS — Z87891 Personal history of nicotine dependence: Secondary | ICD-10-CM | POA: Diagnosis not present

## 2015-02-26 DIAGNOSIS — M546 Pain in thoracic spine: Secondary | ICD-10-CM | POA: Insufficient documentation

## 2015-02-26 DIAGNOSIS — Z79899 Other long term (current) drug therapy: Secondary | ICD-10-CM | POA: Diagnosis not present

## 2015-02-26 DIAGNOSIS — I10 Essential (primary) hypertension: Secondary | ICD-10-CM | POA: Insufficient documentation

## 2015-02-26 DIAGNOSIS — G4730 Sleep apnea, unspecified: Secondary | ICD-10-CM | POA: Insufficient documentation

## 2015-02-26 DIAGNOSIS — Z7982 Long term (current) use of aspirin: Secondary | ICD-10-CM | POA: Diagnosis not present

## 2015-02-26 DIAGNOSIS — I509 Heart failure, unspecified: Secondary | ICD-10-CM | POA: Diagnosis not present

## 2015-02-26 DIAGNOSIS — M1990 Unspecified osteoarthritis, unspecified site: Secondary | ICD-10-CM | POA: Diagnosis not present

## 2015-02-26 DIAGNOSIS — E119 Type 2 diabetes mellitus without complications: Secondary | ICD-10-CM | POA: Diagnosis not present

## 2015-02-26 LAB — URINALYSIS, ROUTINE W REFLEX MICROSCOPIC
BILIRUBIN URINE: NEGATIVE
Glucose, UA: NEGATIVE mg/dL
HGB URINE DIPSTICK: NEGATIVE
KETONES UR: NEGATIVE mg/dL
Leukocytes, UA: NEGATIVE
Nitrite: NEGATIVE
PH: 5 (ref 5.0–8.0)
Protein, ur: NEGATIVE mg/dL
Specific Gravity, Urine: 1.014 (ref 1.005–1.030)
Urobilinogen, UA: 0.2 mg/dL (ref 0.0–1.0)

## 2015-02-26 MED ORDER — TRAMADOL HCL 50 MG PO TABS
50.0000 mg | ORAL_TABLET | Freq: Four times a day (QID) | ORAL | Status: DC | PRN
Start: 2015-02-26 — End: 2016-12-06

## 2015-02-26 NOTE — ED Notes (Signed)
D/c home with family- d/c paperwork faxed to MD office per pt request- release signed by pt- directed to pharmacy to pick up medications

## 2015-02-26 NOTE — Discharge Instructions (Signed)
Since your pain has been going on for 2 months, you will likely need an MR of your thoracic spine to assess for a herniated disc or pinched nerve.  Back Pain, Adult Low back pain is very common. About 1 in 5 people have back pain.The cause of low back pain is rarely dangerous. The pain often gets better over time.About half of people with a sudden onset of back pain feel better in just 2 weeks. About 8 in 10 people feel better by 6 weeks.  CAUSES Some common causes of back pain include:  Strain of the muscles or ligaments supporting the spine.  Wear and tear (degeneration) of the spinal discs.  Arthritis.  Direct injury to the back. DIAGNOSIS Most of the time, the direct cause of low back pain is not known.However, back pain can be treated effectively even when the exact cause of the pain is unknown.Answering your caregiver's questions about your overall health and symptoms is one of the most accurate ways to make sure the cause of your pain is not dangerous. If your caregiver needs more information, he or she may order lab work or imaging tests (X-rays or MRIs).However, even if imaging tests show changes in your back, this usually does not require surgery. HOME CARE INSTRUCTIONS For many people, back pain returns.Since low back pain is rarely dangerous, it is often a condition that people can learn to [facility] their own.   Remain active. It is stressful on the back to sit or stand in one place. Do not sit, drive, or stand in one place for more than 30 minutes at a time. Take short walks on level surfaces as soon as pain allows.Try to increase the length of time you walk each day.  Do not stay in bed.Resting more than 1 or 2 days can delay your recovery.  Do not avoid exercise or work.Your body is made to move.It is not dangerous to be active, even though your back may hurt.Your back will likely heal faster if you return to being active before your pain is gone.  Pay attention to  your body when you bend and lift. Many people have less discomfortwhen lifting if they bend their knees, keep the load close to their bodies,and avoid twisting. Often, the most comfortable positions are those that put less stress on your recovering back.  Find a comfortable position to sleep. Use a firm mattress and lie on your side with your knees slightly bent. If you lie on your back, put a pillow under your knees.  Only take over-the-counter or prescription medicines as directed by your caregiver. Over-the-counter medicines to reduce pain and inflammation are often the most helpful.Your caregiver may prescribe muscle relaxant drugs.These medicines help dull your pain so you can more quickly return to your normal activities and healthy exercise.  Put ice on the injured area.  Put ice in a plastic bag.  Place a towel between your skin and the bag.  Leave the ice on for 15-20 minutes, 03-04 times a day for the first 2 to 3 days. After that, ice and heat may be alternated to reduce pain and spasms.  Ask your caregiver about trying back exercises and gentle massage. This may be of some benefit.  Avoid feeling anxious or stressed.Stress increases muscle tension and can worsen back pain.It is important to recognize when you are anxious or stressed and learn ways to manage it.Exercise is a great option. SEEK MEDICAL CARE IF:  You have pain that is  not relieved with rest or medicine.  You have pain that does not improve in 1 week.  You have new symptoms.  You are generally not feeling well. SEEK IMMEDIATE MEDICAL CARE IF:   You have pain that radiates from your back into your legs.  You develop new bowel or bladder control problems.  You have unusual weakness or numbness in your arms or legs.  You develop nausea or vomiting.  You develop abdominal pain.  You feel faint. Document Released: 12/14/2005 Document Revised: 06/14/2012 Document Reviewed: 04/17/2014 [facility]  Patient Information 2015 [place], [place]. This information is not intended to replace advice given to you by your health care provider. Make sure you discuss any questions you have with your health care provider.

## 2015-02-26 NOTE — ED Notes (Signed)
Pt c/o left flank pain/burning x 2 months seen by PMD x 1 for same

## 2015-02-26 NOTE — ED Provider Notes (Signed)
CSN: [id number]     Arrival date & time 02/26/15  1344 History   First MD Initiated Contact with Patient 02/26/15 1504     Chief Complaint  Patient presents with  . Flank Pain     (Consider location/radiation/quality/duration/timing/severity/associated sxs/prior Treatment) HPI Comments: Patient presents with a burning pain to his back. He states it's been going on for 2 months. He states it starts in his left mid back and radiates around to his left side. He states it's worse with certain movements. If he bends over it comes on if he stands up in a certain position that brings it on. He denies any pleuritic pain. He denies any shortness of breath. He denies any cough or chest congestion. He denies any urinary symptoms. He denies any abdominal pain. He denies any testicular pain. He denies any weight changes. He states he currently doesn't hurt at all but it's been off and on for the last 2 months since becoming more frequent in nature. He seen his primary care physician for this pain then he's been taking Celebrex without relief.   Past Medical History  Diagnosis Date  . Pacemaker   . CHF (congestive heart failure)   . Dysrhythmia   . Shortness of breath     with exertion  . Sleep apnea     cpap setting at 11   . Diabetes mellitus without complication     po meds  . GERD (gastroesophageal reflux disease)   . Arthritis   . Hypertension 11/24/11    ECHO-EF52%   Past Surgical History  Procedure Laterality Date  . Cardiac catheterization    . Right hand surgery       pins placed in hand due to accident   . Mass excision  10/21/2012    Procedure: EXCISION MASS;  Surgeon: Mcarthur Rossetti, MD;  Location: [facility];  Service: Orthopedics;  Laterality: Left;  Excision Left Ring Finger Mass  . Insert / replace / remove pacemaker    . Colonoscopy with propofol N/A 07/13/2014    Procedure: COLONOSCOPY WITH PROPOFOL;  Surgeon: Beryle Beams, MD;  Location: [facility];  Service:  Endoscopy;  Laterality: N/A;  aicd   History reviewed. No pertinent family history. History  Substance Use Topics  . Smoking status: Former Smoker    Types: Cigarettes    Quit date: 05/28/2013  . Smokeless tobacco: Never Used     Comment: still smokes a cigar once and a while  . Alcohol Use: Yes     Comment: 2x per week     Review of Systems  Constitutional: Negative for fever, chills, diaphoresis and fatigue.  HENT: Negative for congestion, rhinorrhea and sneezing.   Eyes: Negative.   Respiratory: Negative for cough, chest tightness and shortness of breath.   Cardiovascular: Negative for chest pain and leg swelling.  Gastrointestinal: Negative for nausea, vomiting, abdominal pain, diarrhea and blood in stool.  Genitourinary: Negative for frequency, hematuria, flank pain and difficulty urinating.  Musculoskeletal: Positive for back pain. Negative for arthralgias.  Skin: Negative for rash.  Neurological: Negative for dizziness, speech difficulty, weakness, numbness and headaches.      Allergies  Nitroglycerin  Home Medications   Prior to Admission medications   Medication Sig Start Date End Date Taking? Authorizing Provider  acetaminophen (TYLENOL) 325 MG tablet Take 325 mg by mouth every 6 (six) hours as needed for mild pain.    Historical Provider, MD  aspirin 325 MG tablet Take 325 mg by  mouth daily.    Historical Provider, MD  carvedilol (COREG) 25 MG tablet Take 50 mg by mouth 2 (two) times daily with a meal.    Historical Provider, MD  cholecalciferol (VITAMIN D) 1000 UNITS tablet Take 1,000 Units by mouth daily.    Historical Provider, MD  colchicine 0.6 MG tablet Take 1 tablet (0.6 mg total) by mouth daily. Take 2 tabs at sign of acute gout flare, then 0.6mg  an hour later, followed by twice daily 01/03/15   Ernestina Patches, MD  digoxin (LANOXIN) 0.25 MG tablet Take 0.5 tablets (0.125 mg total) by mouth daily. 02/19/15   Mihai Croitoru, MD  furosemide (LASIX) 40 MG tablet  Take 40 mg by mouth every morning.    Historical Provider, MD  ibuprofen (ADVIL,MOTRIN) 800 MG tablet Take 800 mg by mouth every 6 (six) hours as needed for moderate pain.  11/07/13   Historical Provider, MD  metFORMIN (GLUCOPHAGE) 500 MG tablet Take 1 tablet (500 mg total) by mouth 2 (two) times daily with a meal. 08/21/14   Troy Sine, MD  omeprazole (PRILOSEC) 20 MG capsule TAKE 1 CAPSULE BY MOUTH DAILY 01/31/15   Troy Sine, MD  oxyCODONE-acetaminophen (PERCOCET) 5-325 MG per tablet Take 1 tablet by mouth every 6 (six) hours as needed. 01/03/15   Ernestina Patches, MD  potassium chloride (K-DUR,KLOR-CON) 10 MEQ tablet Take 10 mEq by mouth daily.    Historical Provider, MD  rosuvastatin (CRESTOR) 20 MG tablet Take 20 mg by mouth every morning.    Historical Provider, MD  spironolactone (ALDACTONE) 25 MG tablet Take 25 mg by mouth 2 (two) times daily.    Historical Provider, MD  topiramate (TOPAMAX) 25 MG tablet Take 3 tablets by mouth daily. 01/09/15   Historical Provider, MD  traMADol (ULTRAM) 50 MG tablet Take 1 tablet (50 mg total) by mouth every 6 (six) hours as needed. 02/26/15   Malvin Johns, MD  UNABLE TO FIND CPAP THERAPHY    Historical Provider, MD  valsartan (DIOVAN) 160 MG tablet Take 160 mg by mouth daily.    Historical Provider, MD   BP 149/88 mmHg  Pulse 68  Temp(Src) 98.6 F (37 C) (Oral)  Resp 16  Ht 5\' 8"  (1.727 m)  Wt 205 lb (92.987 kg)  BMI 31.18 kg/m2  SpO2 98% Physical Exam  Constitutional: He is oriented to person, place, and time. He appears well-developed and well-nourished.  HENT:  Head: Normocephalic and atraumatic.  Eyes: Pupils are equal, round, and reactive to light.  Neck: Normal range of motion. Neck supple.  Cardiovascular: Normal rate, regular rhythm and normal heart sounds.   Pulmonary/Chest: Effort normal and breath sounds normal. No respiratory distress. He has no wheezes. He has no rales. He exhibits no tenderness.  Abdominal: Soft. Bowel sounds are  normal. There is no tenderness. There is no rebound and no guarding.  Musculoskeletal: Normal range of motion. He exhibits no edema.  Patient has no current tenderness but the area that he has the pain is in his lower thoracic spine to the left of the vertebral bodies radiating around to the left flank. There is no bony tenderness to the ribs or the spine.  Lymphadenopathy:    He has no cervical adenopathy.  Neurological: He is alert and oriented to person, place, and time.  Skin: Skin is warm and dry. No rash noted.  Psychiatric: He has a normal mood and affect.    ED Course  Procedures (including critical care time) Labs  Review Labs Reviewed  URINALYSIS, ROUTINE W REFLEX MICROSCOPIC    Imaging Review No results found.   EKG Interpretation None      MDM   Final diagnoses:  Thoracic back pain, unspecified back pain laterality    Patient has a burning pain that starts to the left paraspinal area in the thoracic spine and radiates to the flank. It's worse with certain movements. I suspect that this is a radicular nerve pain. He doesn't have any abdominal tenderness. His urine is clean. There is no evidence of a UTI or kidney stone. I suggested that he follow-up with his primary care physician and he may need an MRI at this point since this been going on for 2 months. I gave him prescription for Ultram to use for his pain. Advised to return if his symptoms worsen.    Malvin Johns, MD 02/26/15 1600

## 2015-03-16 ENCOUNTER — Other Ambulatory Visit: Payer: Self-pay | Admitting: Cardiovascular Disease

## 2015-03-18 ENCOUNTER — Other Ambulatory Visit: Payer: Self-pay | Admitting: Cardiovascular Disease

## 2015-03-18 NOTE — Telephone Encounter (Signed)
Rx(s) sent to pharmacy electronically.  

## 2015-03-19 NOTE — Telephone Encounter (Signed)
Rx refill denied to patient pharmacy

## 2015-03-22 ENCOUNTER — Other Ambulatory Visit: Payer: Self-pay | Admitting: Cardiovascular Disease

## 2015-03-22 NOTE — Telephone Encounter (Signed)
E-sent medication

## 2015-03-26 ENCOUNTER — Telehealth: Payer: Self-pay | Admitting: Cardiovascular Disease

## 2015-03-26 NOTE — Telephone Encounter (Signed)
Advised pt to contact [facility] where he received his supply so they can issue replacement, he voiced understanding.

## 2015-03-26 NOTE — Telephone Encounter (Signed)
Pt need a new C Pap machine,[place]

## 2015-03-27 ENCOUNTER — Telehealth: Payer: Self-pay | Admitting: Cardiovascular Disease

## 2015-03-27 NOTE — Telephone Encounter (Signed)
Pt says he needs a referral yo [place] to get a new C Pap machine please.[name]

## 2015-03-27 NOTE — Telephone Encounter (Signed)
Forward to [place]

## 2015-03-29 NOTE — Telephone Encounter (Signed)
Called and scheduled patient for sleep clinic appointment on 04/11/15

## 2015-04-02 ENCOUNTER — Telehealth: Payer: Self-pay | Admitting: *Deleted

## 2015-04-02 NOTE — Telephone Encounter (Signed)
Left message to return a call. Leave message where he initially got his CPAP machine.

## 2015-04-11 ENCOUNTER — Ambulatory Visit: Payer: Medicare Other | Admitting: Cardiovascular Disease

## 2015-04-16 ENCOUNTER — Telehealth: Payer: Self-pay | Admitting: Cardiovascular Disease

## 2015-04-16 NOTE — Telephone Encounter (Signed)
Pt called in stating that he has not received his CPAP machine yet and [facility] instructed him to contact our office to find out why. Please f/u with the pt  Thanks

## 2015-04-16 NOTE — Telephone Encounter (Signed)
Spoke to patient.  informed patient that he will not be able to get a new C-PAP MACHINE until he has an appointment with Dr Claiborne Billings. Patient missed appointment last week. Patient states his car broke down,but he will be at appointment in June 2016

## 2015-04-20 ENCOUNTER — Other Ambulatory Visit: Payer: Self-pay | Admitting: Cardiovascular Disease

## 2015-05-06 ENCOUNTER — Other Ambulatory Visit: Payer: Self-pay | Admitting: Orthopaedic Surgery

## 2015-05-06 DIAGNOSIS — M5126 Other intervertebral disc displacement, lumbar region: Secondary | ICD-10-CM

## 2015-05-06 DIAGNOSIS — G8929 Other chronic pain: Secondary | ICD-10-CM

## 2015-05-06 DIAGNOSIS — M545 Low back pain: Principal | ICD-10-CM

## 2015-05-13 ENCOUNTER — Ambulatory Visit
Admission: RE | Admit: 2015-05-13 | Discharge: 2015-05-13 | Disposition: A | Discharge status: Home / Self Care | Payer: Medicare Other | Source: Ambulatory Visit | Attending: Orthopaedic Surgery | Admitting: Orthopaedic Surgery

## 2015-05-13 DIAGNOSIS — M545 Low back pain: Principal | ICD-10-CM

## 2015-05-13 DIAGNOSIS — G8929 Other chronic pain: Secondary | ICD-10-CM

## 2015-05-13 DIAGNOSIS — M5126 Other intervertebral disc displacement, lumbar region: Secondary | ICD-10-CM

## 2015-05-13 MED ORDER — IOHEXOL 180 MG/ML  SOLN
15.0000 mL | Freq: Once | INTRAMUSCULAR | Status: AC | PRN
  Administered 2015-05-13: 15 mL via INTRATHECAL

## 2015-05-13 MED ORDER — ONDANSETRON HCL 4 MG/2ML IJ SOLN: 4.0000 mg | Freq: Four times a day (QID) | INTRAMUSCULAR | Status: DC | PRN

## 2015-05-13 MED ORDER — DIAZEPAM 5 MG PO TABS
10.0000 mg | ORAL_TABLET | Freq: Once | ORAL | Status: AC
  Administered 2015-05-13: 10 mg via ORAL

## 2015-05-13 NOTE — Discharge Instructions (Signed)

## 2015-05-18 ENCOUNTER — Other Ambulatory Visit: Payer: Self-pay | Admitting: Cardiovascular Disease

## 2015-05-20 ENCOUNTER — Other Ambulatory Visit: Payer: Self-pay | Admitting: Cardiovascular Disease

## 2015-05-20 NOTE — Telephone Encounter (Signed)
Rx(s) sent to pharmacy electronically. OV 06/10/15 with [name]

## 2015-05-20 NOTE — Telephone Encounter (Signed)
°  1. Which medications need to be refilled? Furosemide(new rx needed)  2. Which pharmacy is medication to be sent to?[facility] on [address]  3. Do they need a 30 day or 90 day supply? 30  4. Would they like a call back once the medication has been sent to the pharmacy? No

## 2015-05-20 NOTE — Telephone Encounter (Signed)
Rx(s) already sent to pharmacy electronically, see refill encounter from 05/18/2015.

## 2015-05-21 ENCOUNTER — Telehealth: Payer: Self-pay | Admitting: Cardiology

## 2015-05-21 ENCOUNTER — Encounter: Payer: Medicare Other | Admitting: *Deleted

## 2015-05-21 NOTE — Telephone Encounter (Signed)
LMOVM reminding pt to send remote transmission.   

## 2015-05-23 ENCOUNTER — Encounter: Payer: Self-pay | Admitting: Cardiology

## 2015-06-06 ENCOUNTER — Ambulatory Visit: Payer: Medicare Other | Admitting: Cardiovascular Disease

## 2015-06-10 ENCOUNTER — Ambulatory Visit: Payer: Medicare Other | Admitting: Cardiovascular Disease

## 2015-06-21 ENCOUNTER — Telehealth: Payer: Self-pay | Admitting: Cardiovascular Disease

## 2015-06-21 NOTE — Telephone Encounter (Signed)
Left message for patients wife, according to wanda, Dr [facility] CMA, the patient had told us he was not using his machine. He did not come to his last 2 appts and before the company will give him more equipment he will need to be seen. Number given for patient to call and make a follow up appt with dr Claiborne Billings for CPAP.

## 2015-06-21 NOTE — Telephone Encounter (Signed)
Pt's wife called in stating that the pt will need a prescription for a mask for his CPAP machine. Please call her back  Thanks

## 2015-06-24 ENCOUNTER — Telehealth: Payer: Self-pay | Admitting: Cardiovascular Disease

## 2015-06-24 NOTE — Telephone Encounter (Signed)
Please call her after 3. Pt is in prison,but he needs his mask for his C-Pap machine.She wants you to order it and she will get it to him please.

## 2015-06-24 NOTE — Telephone Encounter (Signed)
FORWARD TO Barry Brunner ,CMA

## 2015-06-26 NOTE — Telephone Encounter (Signed)
Returned a call to patient's wife. She tells me that everything has been taken care of. He has been incarcerated in  a Medical facility and was given a new CPAP machine. Per patient's wife he will be incarcerated until November 2017.

## 2015-07-22 ENCOUNTER — Encounter: Payer: Self-pay | Admitting: Cardiovascular Disease

## 2015-08-18 ENCOUNTER — Other Ambulatory Visit: Payer: Self-pay | Admitting: Cardiovascular Disease

## 2015-08-19 NOTE — Telephone Encounter (Signed)
Rx(s) sent to pharmacy electronically.  

## 2015-09-05 ENCOUNTER — Encounter: Payer: Self-pay | Admitting: Cardiovascular Disease

## 2015-10-01 ENCOUNTER — Other Ambulatory Visit: Payer: Self-pay | Admitting: Cardiovascular Disease

## 2015-10-01 NOTE — Telephone Encounter (Signed)
REFILL 

## 2015-11-19 ENCOUNTER — Telehealth: Payer: Self-pay | Admitting: Cardiovascular Disease

## 2015-11-19 NOTE — Telephone Encounter (Signed)
Left message for wife to call

## 2015-11-19 NOTE — Telephone Encounter (Signed)
Pt's wife is calling in because the pt is currently incarcerated and the the doctor at the facility would like something documenting why is his on the medication Digoxin , how long he has been on it and if he can take this medication unsupervised. The doctor's name is Dr. Cheral Almas and the fax number is [phone]. Please f/u with the wife   Thanks

## 2015-11-19 NOTE — Telephone Encounter (Signed)
Spoke with pt wife, the pt is having to take his medications under supervision. If we provide a letter containing how long he has been on lanoxin, why he is on digoxin and he can take the medication without supervision, then he can be moved to a facility closer to his home. Will forward to dr Claiborne Billings for approval for the letter.

## 2015-11-22 NOTE — Telephone Encounter (Signed)
Ok for letter

## 2015-12-06 ENCOUNTER — Encounter: Payer: Self-pay | Admitting: *Deleted

## 2015-12-06 NOTE — Telephone Encounter (Signed)
Letter faxed to [facility] of [employer] attention Dr. Cheral Almas to the number provided. Confirmation received. Notified patient's wife letter has been sent. She will call the facility to confirm that they received it.

## 2016-02-06 ENCOUNTER — Other Ambulatory Visit: Payer: Self-pay | Admitting: Cardiovascular Disease

## 2016-02-07 ENCOUNTER — Other Ambulatory Visit: Payer: Self-pay | Admitting: Cardiovascular Disease

## 2016-02-07 NOTE — Telephone Encounter (Signed)
Rx(s) sent to pharmacy electronically.  

## 2016-02-26 ENCOUNTER — Encounter: Payer: Self-pay | Admitting: *Deleted

## 2016-05-17 ENCOUNTER — Other Ambulatory Visit: Payer: Self-pay | Admitting: Cardiovascular Disease

## 2016-05-19 NOTE — Telephone Encounter (Signed)
Rx(s) sent to pharmacy electronically.  

## 2016-12-06 ENCOUNTER — Encounter (HOSPITAL_COMMUNITY): Payer: Self-pay | Admitting: Emergency Medicine

## 2016-12-06 ENCOUNTER — Emergency Department (HOSPITAL_COMMUNITY): Payer: Medicare Other

## 2016-12-06 ENCOUNTER — Observation Stay (HOSPITAL_COMMUNITY)
Admission: EM | Admit: 2016-12-06 | Discharge: 2016-12-07 | Disposition: A | Discharge status: Home / Self Care | Payer: Medicare Other | Attending: Internal Medicine | Admitting: Internal Medicine

## 2016-12-06 DIAGNOSIS — K219 Gastro-esophageal reflux disease without esophagitis: Secondary | ICD-10-CM | POA: Diagnosis not present

## 2016-12-06 DIAGNOSIS — Z79899 Other long term (current) drug therapy: Secondary | ICD-10-CM | POA: Insufficient documentation

## 2016-12-06 DIAGNOSIS — G4733 Obstructive sleep apnea (adult) (pediatric): Secondary | ICD-10-CM | POA: Diagnosis not present

## 2016-12-06 DIAGNOSIS — I252 Old myocardial infarction: Secondary | ICD-10-CM | POA: Insufficient documentation

## 2016-12-06 DIAGNOSIS — I5030 Unspecified diastolic (congestive) heart failure: Secondary | ICD-10-CM | POA: Diagnosis not present

## 2016-12-06 DIAGNOSIS — Z95810 Presence of automatic (implantable) cardiac defibrillator: Secondary | ICD-10-CM | POA: Diagnosis present

## 2016-12-06 DIAGNOSIS — I472 Ventricular tachycardia: Secondary | ICD-10-CM | POA: Insufficient documentation

## 2016-12-06 DIAGNOSIS — E785 Hyperlipidemia, unspecified: Secondary | ICD-10-CM | POA: Diagnosis present

## 2016-12-06 DIAGNOSIS — Z7984 Long term (current) use of oral hypoglycemic drugs: Secondary | ICD-10-CM | POA: Insufficient documentation

## 2016-12-06 DIAGNOSIS — I110 Hypertensive heart disease with heart failure: Secondary | ICD-10-CM | POA: Diagnosis not present

## 2016-12-06 DIAGNOSIS — I5032 Chronic diastolic (congestive) heart failure: Secondary | ICD-10-CM | POA: Diagnosis not present

## 2016-12-06 DIAGNOSIS — Z7982 Long term (current) use of aspirin: Secondary | ICD-10-CM | POA: Insufficient documentation

## 2016-12-06 DIAGNOSIS — I2510 Atherosclerotic heart disease of native coronary artery without angina pectoris: Secondary | ICD-10-CM | POA: Insufficient documentation

## 2016-12-06 DIAGNOSIS — Z9989 Dependence on other enabling machines and devices: Secondary | ICD-10-CM

## 2016-12-06 DIAGNOSIS — R079 Chest pain, unspecified: Principal | ICD-10-CM | POA: Diagnosis present

## 2016-12-06 DIAGNOSIS — F10129 Alcohol abuse with intoxication, unspecified: Secondary | ICD-10-CM | POA: Insufficient documentation

## 2016-12-06 DIAGNOSIS — F17210 Nicotine dependence, cigarettes, uncomplicated: Secondary | ICD-10-CM | POA: Diagnosis not present

## 2016-12-06 DIAGNOSIS — I509 Heart failure, unspecified: Secondary | ICD-10-CM | POA: Diagnosis present

## 2016-12-06 DIAGNOSIS — I428 Other cardiomyopathies: Secondary | ICD-10-CM | POA: Diagnosis not present

## 2016-12-06 DIAGNOSIS — I10 Essential (primary) hypertension: Secondary | ICD-10-CM

## 2016-12-06 DIAGNOSIS — E119 Type 2 diabetes mellitus without complications: Secondary | ICD-10-CM | POA: Diagnosis not present

## 2016-12-06 HISTORY — DX: Hyperlipidemia, unspecified: E78.5

## 2016-12-06 LAB — CBC
HCT: 40.7 % (ref 39.0–52.0)
Hemoglobin: 13.9 g/dL (ref 13.0–17.0)
MCH: 28.7 pg (ref 26.0–34.0)
MCHC: 34.2 g/dL (ref 30.0–36.0)
MCV: 83.9 fL (ref 78.0–100.0)
PLATELETS: 206 10*3/uL (ref 150–400)
RBC: 4.85 MIL/uL (ref 4.22–5.81)
RDW: 14.2 % (ref 11.5–15.5)
WBC: 11.2 10*3/uL — ABNORMAL HIGH (ref 4.0–10.5)

## 2016-12-06 LAB — BASIC METABOLIC PANEL
Anion gap: 8 (ref 5–15)
BUN: 9 mg/dL (ref 6–20)
CO2: 27 mmol/L (ref 22–32)
CREATININE: 1.13 mg/dL (ref 0.61–1.24)
Calcium: 8.6 mg/dL — ABNORMAL LOW (ref 8.9–10.3)
Chloride: 110 mmol/L (ref 101–111)
GFR calc Af Amer: >60 mL/min (ref >60)
Glucose, Bld: 92 mg/dL (ref 65–99)
Potassium: 4.1 mmol/L (ref 3.5–5.1)
SODIUM: 145 mmol/L (ref 135–145)

## 2016-12-06 LAB — LIPID PANEL
Cholesterol: 148 mg/dL (ref 0–200)
HDL: 39 mg/dL — AB (ref >40)
LDL Cholesterol: 69 mg/dL (ref 0–99)
TRIGLYCERIDES: 201 mg/dL — AB (ref <150)
Total CHOL/HDL Ratio: 3.8 RATIO
VLDL: 40 mg/dL (ref 0–40)

## 2016-12-06 LAB — GLUCOSE, CAPILLARY: GLUCOSE-CAPILLARY: 97 mg/dL (ref 65–99)

## 2016-12-06 LAB — TSH: TSH: 0.755 u[IU]/mL (ref 0.350–4.500)

## 2016-12-06 LAB — I-STAT TROPONIN, ED: Troponin i, poc: 0 ng/mL (ref 0.00–0.08)

## 2016-12-06 LAB — DIGOXIN LEVEL: Digoxin Level: 0.4 ng/mL — ABNORMAL LOW (ref 0.8–2.0)

## 2016-12-06 LAB — ETHANOL: Alcohol, Ethyl (B): 201 mg/dL — ABNORMAL HIGH (ref <5)

## 2016-12-06 LAB — TROPONIN I: Troponin I: <0.03 ng/mL (ref <0.03)

## 2016-12-06 MED ORDER — ALLOPURINOL 300 MG PO TABS
300.0000 mg | ORAL_TABLET | Freq: Every day | ORAL | Status: DC
  Administered 2016-12-07: 300 mg via ORAL
  Filled 2016-12-06: qty 1

## 2016-12-06 MED ORDER — CARVEDILOL 25 MG PO TABS: 50.0000 mg | ORAL_TABLET | Freq: Two times a day (BID) | ORAL | Status: DC

## 2016-12-06 MED ORDER — ATORVASTATIN CALCIUM 20 MG PO TABS
20.0000 mg | ORAL_TABLET | Freq: Every day | ORAL | Status: DC
  Administered 2016-12-07: 20 mg via ORAL
  Filled 2016-12-06: qty 1

## 2016-12-06 MED ORDER — INSULIN ASPART 100 UNIT/ML ~~LOC~~ SOLN: 0.0000 [IU] | Freq: Three times a day (TID) | SUBCUTANEOUS | Status: DC

## 2016-12-06 MED ORDER — ACETAMINOPHEN 325 MG PO TABS: 650.0000 mg | ORAL_TABLET | ORAL | Status: DC | PRN

## 2016-12-06 MED ORDER — MORPHINE SULFATE (PF) 4 MG/ML IV SOLN
4.0000 mg | Freq: Once | INTRAVENOUS | Status: AC
  Administered 2016-12-06: 4 mg via INTRAVENOUS
  Filled 2016-12-06: qty 1

## 2016-12-06 MED ORDER — CARVEDILOL 25 MG PO TABS
50.0000 mg | ORAL_TABLET | Freq: Once | ORAL | Status: AC
  Administered 2016-12-06: 50 mg via ORAL
  Filled 2016-12-06: qty 2

## 2016-12-06 MED ORDER — DIGOXIN 125 MCG PO TABS
0.2500 mg | ORAL_TABLET | ORAL | Status: DC
  Administered 2016-12-07: 0.25 mg via ORAL
  Filled 2016-12-06: qty 2

## 2016-12-06 MED ORDER — ONDANSETRON HCL 4 MG/2ML IJ SOLN: 4.0000 mg | Freq: Four times a day (QID) | INTRAMUSCULAR | Status: DC | PRN

## 2016-12-06 MED ORDER — GI COCKTAIL ~~LOC~~: 30.0000 mL | Freq: Four times a day (QID) | ORAL | Status: DC | PRN

## 2016-12-06 MED ORDER — PNEUMOCOCCAL VAC POLYVALENT 25 MCG/0.5ML IJ INJ
0.5000 mL | INJECTION | INTRAMUSCULAR | Status: DC
  Filled 2016-12-06: qty 0.5

## 2016-12-06 MED ORDER — ENOXAPARIN SODIUM 40 MG/0.4ML ~~LOC~~ SOLN
40.0000 mg | SUBCUTANEOUS | Status: DC
  Administered 2016-12-06: 40 mg via SUBCUTANEOUS
  Filled 2016-12-06: qty 0.4

## 2016-12-06 MED ORDER — MORPHINE SULFATE (PF) 2 MG/ML IV SOLN: 2.0000 mg | INTRAVENOUS | Status: DC | PRN

## 2016-12-06 MED ORDER — IRBESARTAN 300 MG PO TABS
150.0000 mg | ORAL_TABLET | Freq: Every day | ORAL | Status: DC
  Administered 2016-12-07: 150 mg via ORAL
  Filled 2016-12-06: qty 1

## 2016-12-06 MED ORDER — ASPIRIN 325 MG PO TABS
325.0000 mg | ORAL_TABLET | Freq: Every day | ORAL | Status: DC
  Administered 2016-12-07: 325 mg via ORAL
  Filled 2016-12-06: qty 1

## 2016-12-06 NOTE — ED Triage Notes (Signed)
Per [facility] called out to chest pain.  Patient states he started feeling chest pressure "like someone is standing on my chest", pain started at approximate 1800.  Patient has history of CHF and an MI approximately 10 years ago.  Patient received 324mg  of aspirin PTA from EMS, patient refused nitroglycerin stating he is allergic.  Patient is alert and oriented at this time.

## 2016-12-06 NOTE — ED Provider Notes (Signed)
[facility] DEPT Provider Note   CSN: [id number] Arrival date & time: 12/06/16  1846     History   Chief Complaint Chief Complaint  Patient presents with  . Chest Pain    HPI [name] is a 50 y.o. male.  Patient with past medical history of CAD, ICD (biotronik), CHF, DM, prior MI presents to the emergency department with chief complaint of chest pain, shortness of breath, and chest pressure. He states that for the past 2 weeks, he has felt his fibrillator fire approximately 4 times. He sees [name] at [facility] heart and vascular. Patient states that today after getting out of the shower, he felt an immense amount of chest pressure, and became increasingly short of breath. EMS was called, and patient was transported to the hospital. He was given aspirin by EMS. He declined sublingual nitroglycerin. He reports that he still feels chest pressure and shortness of breath.   The history is provided by the patient. No language interpreter was used.    Past Medical History:  Diagnosis Date  . Arthritis   . CHF (congestive heart failure) ([place])   . Diabetes mellitus without complication (HCC)    po meds  . Dysrhythmia   . GERD (gastroesophageal reflux disease)   . Hypertension 11/24/11   ECHO-EF52%  . MI (myocardial infarction)   . Pacemaker   . Shortness of breath    with exertion  . Sleep apnea    cpap setting at 11     Patient Active Problem List   Diagnosis Date Noted  . Chest pain with low risk of acute coronary syndrome 09/27/2013  . Cough 09/27/2013  . CAD (coronary artery disease)- minor CAD at cath 2005, low risk Nuc 7/09 09/27/2013  . Non-ischemic cardiomyopathy- last EF 35-40% by echo Nov 2012 06/05/2013  . Ventricular tachycardia ([place]) 06/05/2013  . ICD (implantable cardioverter-defibrillator) in place 06/05/2013  . HTN (hypertension) 06/05/2013  . Sleep apnea, obstructive 06/05/2013  . Obesity (BMI 30.0-34.9) 06/05/2013  . Mass of finger of left hand  10/21/2012  . Obstructive apnea 09/14/2011  . CCF (congestive cardiac failure) ([place]) 09/03/2010  . Disease of supporting structures of teeth 09/03/2010  . Allergic rhinitis 09/02/2010  . Acid reflux 07/31/2010  . HLD (hyperlipidemia) 05/07/2010  . Abnormal blood sugar 05/15/2009    Past Surgical History:  Procedure Laterality Date  . CARDIAC CATHETERIZATION    . COLONOSCOPY WITH PROPOFOL N/A 07/13/2014   Procedure: COLONOSCOPY WITH PROPOFOL;  Surgeon: Beryle Beams, MD;  Location: [facility] ENDOSCOPY;  Service: Endoscopy;  Laterality: N/A;  aicd  . INSERT / REPLACE / REMOVE PACEMAKER    . MASS EXCISION  10/21/2012   Procedure: EXCISION MASS;  Surgeon: Mcarthur Rossetti, MD;  Location: [facility] ORS;  Service: Orthopedics;  Laterality: Left;  Excision Left Ring Finger Mass  . right hand surgery      pins placed in hand due to accident        Home Medications    Prior to Admission medications   Medication Sig Start Date End Date Taking? Authorizing Provider  acetaminophen (TYLENOL) 325 MG tablet Take 325 mg by mouth every 6 (six) hours as needed for mild pain.    Historical Provider, MD  aspirin 325 MG tablet Take 325 mg by mouth daily.    Historical Provider, MD  carvedilol (COREG) 25 MG tablet Take 50 mg by mouth 2 (two) times daily with a meal.    Historical Provider, MD  carvedilol (COREG) 25  MG tablet TAKE 2 TABLETS BY MOUTH TWICE DAILY 04/22/15   Troy Sine, MD  cholecalciferol (VITAMIN D) 1000 UNITS tablet Take 1,000 Units by mouth daily.    Historical Provider, MD  colchicine 0.6 MG tablet Take 1 tablet (0.6 mg total) by mouth daily. Take 2 tabs at sign of acute gout flare, then 0.6mg  an hour later, followed by twice daily 01/03/15   Ernestina Patches, MD  digoxin (LANOXIN) 0.25 MG tablet Take 0.5 tablets (0.125 mg total) by mouth daily. 02/19/15   Mihai Croitoru, MD  furosemide (LASIX) 40 MG tablet Take 40 mg by mouth every morning.    Historical Provider, MD  furosemide (LASIX) 40 MG  tablet TAKE 1 TABLET BY MOUTH DAILY 08/19/15   Troy Sine, MD  gabapentin (NEURONTIN) 300 MG capsule  03/07/15   Historical Provider, MD  ibuprofen (ADVIL,MOTRIN) 800 MG tablet Take 800 mg by mouth every 6 (six) hours as needed for moderate pain.  11/07/13   Historical Provider, MD  metFORMIN (GLUCOPHAGE) 500 MG tablet Take 1 tablet (500 mg total) by mouth 2 (two) times daily with a meal. <PLEASE MAKE APPOINTMENT WITH [name] FOR REFILLS> 03/18/15   Troy Sine, MD  omeprazole (PRILOSEC) 20 MG capsule TAKE ONE CAPSULE BY MOUTH EVERY DAY 02/07/16   Troy Sine, MD  oxyCODONE-acetaminophen (PERCOCET) 5-325 MG per tablet Take 1 tablet by mouth every 6 (six) hours as needed. 01/03/15   Ernestina Patches, MD  potassium chloride (K-DUR,KLOR-CON) 10 MEQ tablet Take 10 mEq by mouth daily.    Historical Provider, MD  potassium chloride (MICRO-K) 10 MEQ CR capsule TAKE ONE CAPSULE BY MOUTH DAILY 03/22/15   Troy Sine, MD  rosuvastatin (CRESTOR) 20 MG tablet Take 20 mg by mouth every morning.    Historical Provider, MD  spironolactone (ALDACTONE) 25 MG tablet Take 25 mg by mouth 2 (two) times daily.    Historical Provider, MD  topiramate (TOPAMAX) 25 MG tablet Take 3 tablets by mouth daily. 01/09/15   Historical Provider, MD  traMADol (ULTRAM) 50 MG tablet Take 1 tablet (50 mg total) by mouth every 6 (six) hours as needed. 02/26/15   Malvin Johns, MD  UNABLE TO FIND CPAP THERAPHY    Historical Provider, MD  valsartan (DIOVAN) 160 MG tablet Take 160 mg by mouth daily.    Historical Provider, MD    Family History No family history on file.  Social History Social History  Substance Use Topics  . Smoking status: Former Smoker    Types: Cigarettes    Quit date: 05/28/2013  . Smokeless tobacco: Never Used     Comment: still smokes a cigar once and a while  . Alcohol use Yes     Comment: 2x per week      Allergies   Macrodantin [nitrofurantoin macrocrystal] and Nitroglycerin   Review of  Systems Review of Systems  Respiratory: Positive for chest tightness and shortness of breath.   All other systems reviewed and are negative.    Physical Exam Updated Vital Signs There were no vitals taken for this visit.  Physical Exam  Constitutional: He is oriented to person, place, and time. He appears well-developed and well-nourished.  HENT:  Head: Normocephalic and atraumatic.  Eyes: Conjunctivae and EOM are normal. Pupils are equal, round, and reactive to light. Right eye exhibits no discharge. Left eye exhibits no discharge. No scleral icterus.  Neck: Normal range of motion. Neck supple. No JVD present.  Cardiovascular: Normal rate,  regular rhythm and normal heart sounds.  Exam reveals no gallop and no friction rub.   No murmur heard. Pulmonary/Chest: Effort normal and breath sounds normal. No respiratory distress. He has no wheezes. He has no rales. He exhibits no tenderness.  Abdominal: Soft. He exhibits no distension and no mass. There is no tenderness. There is no rebound and no guarding.  Musculoskeletal: Normal range of motion. He exhibits no edema or tenderness.  Neurological: He is alert and oriented to person, place, and time.  Skin: Skin is warm and dry.  Psychiatric: He has a normal mood and affect. His behavior is normal. Judgment and thought content normal.  Nursing note and vitals reviewed.    ED Treatments / Results  Labs (all labs ordered are listed, but only abnormal results are displayed) Labs Reviewed - No data to display  EKG  EKG Interpretation None       Radiology Dg Chest 2 View  Result Date: 12/06/2016 CLINICAL DATA:  Initial evaluation for acute chest pain. EXAM: CHEST  2 VIEW COMPARISON:  Prior radiograph from 03/29/2013. FINDINGS: Left-sided transvenous pacemaker/ AICD in place, stable. Cardiac and mediastinal silhouettes are stable in size and contour, and remain within normal limits. Lungs are normally inflated. No focal infiltrate,  pulmonary edema, or pleural effusion. No pneumothorax. No acute osseus abnormality. IMPRESSION: No active cardiopulmonary disease. Electronically Signed   By: Jeannine Boga M.D.   On: 12/06/2016 20:27    Procedures Procedures (including critical care time)  Medications Ordered in ED Medications - No data to display   Initial Impression / Assessment and Plan / ED Course  I have reviewed the triage vital signs and the nursing notes.  Pertinent labs & imaging results that were available during my care of the patient were reviewed by me and considered in my medical decision making (see chart for details).  Clinical Course    Issue with chest pressure and shortness of breath. Will check labs, will reassess.  Will need to interrogate pacemaker.  Troponin is negative, no ischemic EKG changes. Pacemaker report shows normal function, no defibrillations. Patient discussed with cardiology, who will consult, but recommends medicine admission to repeat troponins and possible stress test in the morning.  Final Clinical Impressions(s) / ED Diagnoses   Final diagnoses:  Chest pain, unspecified type    New Prescriptions New Prescriptions   No medications on file     Montine Circle, PA-C 12/06/16 2129    Pattricia Boss, MD 12/06/16 2311

## 2016-12-06 NOTE — ED Notes (Signed)
Attempted report x1. 

## 2016-12-06 NOTE — H&P (Signed)
History and Physical    [name] [id number] DOB: [birth year] DOA: 12/06/2016  PCP: no PCP - recently released from prison and does not have a current PCP (planning to see Dr. Sherrilee Gilles again) Consultants:  Claiborne Billings - cardiology Patient coming from: home - lives with wife and 3 children  Chief Complaint: chest pain  HPI: [name] is a 50 y.o. male with medical history significant of CAD, HTN, DM, CHF (grade 1 diastolic dysfunction), and OSA (on CPAP but not for the last few months due to a malfunctioning mask) presenting with chest pain.  He reports feeling somewhat nauseated and his chest felt like someone jumped on him.  +SOB.  Left arm went numb.  Pain went away but continued to have aching after.  Pain started while he was on the toilet.  Got up and went to sit on chair, staggering a bit.  The pain was located in the left chest and axilla.  Symptoms lasted about 3-4 minutes.  Resolved and never came back.  Ocassional SOB even when not having chest pain (in review of the chart, this appears to have been chronic).  Has complained that defibrillator has been "pinching" him, thinks it has fired several times.  [employer] evaluated and it is functioning properly and has not fired.  He was imprisoned for a period of time (aprroximately 6/16-11/17) for "violence" (denies drug use) and was discharged about 1 week ago.  He and his family are experiencing some transitional difficulties, as would be expected in this situation.  (In review of the [employer], he has multiple arrests and convictions for larceny; DWI; and driving with a revoked license).    MPS 06/24/15 - low risk study, low exercising heart rate due to beta blocker. Echo 12/12/13 - EF 99991111, grade 1 diastolic dysfunction  ED Course: Per PA-C [place]: Issue with chest pressure and shortness of breath. Will check labs, will reassess. Will need to interrogate pacemaker.  Troponin is negative, no ischemic EKG  changes. Pacemaker report shows normal function, no defibrillations. Patient discussed with cardiology, who will consult, but recommends medicine admission to repeat troponins and possible stress test in the morning.   Review of Systems: As per HPI; otherwise 10 point review of systems reviewed and negative.   Ambulatory Status:  Ambulates without assistance  Past Medical History:  Diagnosis Date  . Arthritis   . CHF (congestive heart failure) (HCC)    preserved EF, grade 1 diastolic dysfunction in 123456  . Diabetes mellitus without complication ([place])    no medications at this time  . Dysrhythmia   . GERD (gastroesophageal reflux disease)   . Hyperlipidemia   . Hypertension 11/24/11   ECHO-EF52%  . MI (myocardial infarction) 2001  . Pacemaker    with defibrillator  . Shortness of breath    with exertion  . Sleep apnea    cpap setting at 9     Past Surgical History:  Procedure Laterality Date  . CARDIAC CATHETERIZATION    . COLONOSCOPY WITH PROPOFOL N/A 07/13/2014   Procedure: COLONOSCOPY WITH PROPOFOL;  Surgeon: Beryle Beams, MD;  Location: [facility];  Service: Endoscopy;  Laterality: N/A;  aicd  . INSERT / REPLACE / REMOVE PACEMAKER    . MASS EXCISION  10/21/2012   Procedure: EXCISION MASS;  Surgeon: Mcarthur Rossetti, MD;  Location: [facility]S;  Service: Orthopedics;  Laterality: Left;  Excision Left Ring Finger Mass  . right hand surgery  pins placed in hand due to accident     Social History   Social History  . Marital status: Married    Spouse name: N/A  . Number of children: N/A  . Years of education: N/A   Occupational History  . disabled    Social History Main Topics  . Smoking status: Current Every Day Smoker    Packs/day: 0.50    Types: Cigarettes  . Smokeless tobacco: Never Used  . Alcohol use No  . Drug use: No  . Sexual activity: Not on file   Other Topics Concern  . Not on file   Social History Narrative  . No narrative on file     Allergies  Allergen Reactions  . Nitroglycerin Hives    Sub lingual broke him out in hives 2009    Family History  Problem Relation Age of Onset  . Lymphoma Father 46    Prior to Admission medications   Medication Sig Start Date End Date Taking? Authorizing Provider  acetaminophen (TYLENOL) 325 MG tablet Take 325 mg by mouth every 6 (six) hours as needed for mild pain.    Historical Provider, MD  aspirin 325 MG tablet Take 325 mg by mouth daily.    Historical Provider, MD  carvedilol (COREG) 25 MG tablet Take 50 mg by mouth 2 (two) times daily with a meal.    Historical Provider, MD  carvedilol (COREG) 25 MG tablet TAKE 2 TABLETS BY MOUTH TWICE DAILY 04/22/15   Troy Sine, MD  cholecalciferol (VITAMIN D) 1000 UNITS tablet Take 1,000 Units by mouth daily.    Historical Provider, MD  colchicine 0.6 MG tablet Take 1 tablet (0.6 mg total) by mouth daily. Take 2 tabs at sign of acute gout flare, then 0.6mg  an hour later, followed by twice daily 01/03/15   Ernestina Patches, MD  digoxin (LANOXIN) 0.25 MG tablet Take 0.5 tablets (0.125 mg total) by mouth daily. 02/19/15   Mihai Croitoru, MD  furosemide (LASIX) 40 MG tablet Take 40 mg by mouth every morning.    Historical Provider, MD  furosemide (LASIX) 40 MG tablet TAKE 1 TABLET BY MOUTH DAILY 08/19/15   Troy Sine, MD  gabapentin (NEURONTIN) 300 MG capsule  03/07/15   Historical Provider, MD  ibuprofen (ADVIL,MOTRIN) 800 MG tablet Take 800 mg by mouth every 6 (six) hours as needed for moderate pain.  11/07/13   Historical Provider, MD  metFORMIN (GLUCOPHAGE) 500 MG tablet Take 1 tablet (500 mg total) by mouth 2 (two) times daily with a meal. <PLEASE MAKE APPOINTMENT WITH [name] FOR REFILLS> 03/18/15   Troy Sine, MD  omeprazole (PRILOSEC) 20 MG capsule TAKE ONE CAPSULE BY MOUTH EVERY DAY 02/07/16   Troy Sine, MD  oxyCODONE-acetaminophen (PERCOCET) 5-325 MG per tablet Take 1 tablet by mouth every 6 (six) hours as needed. 01/03/15    Ernestina Patches, MD  potassium chloride (K-DUR,KLOR-CON) 10 MEQ tablet Take 10 mEq by mouth daily.    Historical Provider, MD  potassium chloride (MICRO-K) 10 MEQ CR capsule TAKE ONE CAPSULE BY MOUTH DAILY 03/22/15   Troy Sine, MD  rosuvastatin (CRESTOR) 20 MG tablet Take 20 mg by mouth every morning.    Historical Provider, MD  spironolactone (ALDACTONE) 25 MG tablet Take 25 mg by mouth 2 (two) times daily.    Historical Provider, MD  topiramate (TOPAMAX) 25 MG tablet Take 3 tablets by mouth daily. 01/09/15   Historical Provider, MD  traMADol (ULTRAM) 50 MG  tablet Take 1 tablet (50 mg total) by mouth every 6 (six) hours as needed. 02/26/15   Malvin Johns, MD  UNABLE TO FIND CPAP THERAPHY    Historical Provider, MD  valsartan (DIOVAN) 160 MG tablet Take 160 mg by mouth daily.    Historical Provider, MD    Physical Exam: Vitals:   12/06/16 2115 12/06/16 2130 12/06/16 2145 12/06/16 2200  BP: 114/80 121/84 125/80 113/82  Pulse: 67 76 64 (!) 59  Resp: 14 11 14 14   SpO2: 98% 95% 96% 97%     General: Appears calm and comfortable and is NAD Eyes:  PERRL, EOMI, normal lids, iris; bilateral conjunctival injection ENT:  grossly normal hearing, lips & tongue, mmm Neck:  no LAD, masses or thyromegaly Cardiovascular:  RRR, no m/r/g. No LE edema.  Respiratory:  CTA bilaterally, no w/r/r. Normal respiratory effort. Abdomen:  soft, ntnd, NABS Skin:  no rash or induration seen on limited exam Musculoskeletal:  grossly normal tone BUE/BLE, good ROM, no bony abnormality Psychiatric:  grossly normal mood and affect, speech fluent and appropriate but with mild slurring, AOx3 Neurologic:  CN 2-12 grossly intact, moves all extremities in coordinated fashion, sensation intact  Labs on Admission: I have personally reviewed following labs and imaging studies  CBC:  Recent Labs Lab 12/06/16 1918  WBC 11.2*  HGB 13.9  HCT 40.7  MCV 83.9  PLT 99991111   Basic Metabolic Panel:  Recent Labs Lab  12/06/16 1918  NA 145  K 4.1  CL 110  CO2 27  GLUCOSE 92  BUN 9  CREATININE 1.13  CALCIUM 8.6*   GFR: CrCl cannot be calculated (Unknown ideal weight.). Liver Function Tests: No results for input(s): AST, ALT, ALKPHOS, BILITOT, PROT, ALBUMIN in the last 168 hours. No results for input(s): LIPASE, AMYLASE in the last 168 hours. No results for input(s): AMMONIA in the last 168 hours. Coagulation Profile: No results for input(s): INR, PROTIME in the last 168 hours. Cardiac Enzymes: No results for input(s): CKTOTAL, CKMB, CKMBINDEX, TROPONINI in the last 168 hours. BNP (last 3 results) No results for input(s): PROBNP in the last 8760 hours. HbA1C: No results for input(s): HGBA1C in the last 72 hours. CBG: No results for input(s): GLUCAP in the last 168 hours. Lipid Profile: No results for input(s): CHOL, HDL, LDLCALC, TRIG, CHOLHDL, LDLDIRECT in the last 72 hours. Thyroid Function Tests: No results for input(s): TSH, T4TOTAL, FREET4, T3FREE, THYROIDAB in the last 72 hours. Anemia Panel: No results for input(s): VITAMINB12, FOLATE, FERRITIN, TIBC, IRON, RETICCTPCT in the last 72 hours. Urine analysis:    Component Value Date/Time   COLORURINE YELLOW 02/26/2015 [place] 02/26/2015 1355   LABSPEC 1.014 02/26/2015 1355   PHURINE 5.0 02/26/2015 1355   GLUCOSEU NEGATIVE 02/26/2015 1355   HGBUR NEGATIVE 02/26/2015 [place] 02/26/2015 1355   KETONESUR NEGATIVE 02/26/2015 1355   PROTEINUR NEGATIVE 02/26/2015 1355   UROBILINOGEN 0.2 02/26/2015 1355   NITRITE NEGATIVE 02/26/2015 1355   LEUKOCYTESUR NEGATIVE 02/26/2015 1355    Creatinine Clearance: CrCl cannot be calculated (Unknown ideal weight.).  Sepsis Labs: @LABRCNTIP (procalcitonin:4,lacticidven:4) )No results found for this or any previous visit (from the past 240 hour(s)).   Radiological Exams on Admission: Dg Chest 2 View  Result Date: 12/06/2016 CLINICAL DATA:  Initial  evaluation for acute chest pain. EXAM: CHEST  2 VIEW COMPARISON:  Prior radiograph from 03/29/2013. FINDINGS: Left-sided transvenous pacemaker/ AICD in place, stable. Cardiac and mediastinal silhouettes are stable in  size and contour, and remain within normal limits. Lungs are normally inflated. No focal infiltrate, pulmonary edema, or pleural effusion. No pneumothorax. No acute osseus abnormality. IMPRESSION: No active cardiopulmonary disease. Electronically Signed   By: Jeannine Boga M.D.   On: 12/06/2016 20:27    EKG: Independently reviewed.  NSR with rate 68; nonspecific ST changes with no evidence of acute ischemia  Assessment/Plan Principal Problem:   Chest pain Active Problems:   ICD (implantable cardioverter-defibrillator) in place   HTN (hypertension)   Sleep apnea, obstructive   (HFpEF) heart failure with preserved ejection fraction (HCC)   HLD (hyperlipidemia)   Diabetes mellitus type 2, diet-controlled ([place])   Chest pain -Patient with left-sided chest pain that came on while sitting on the toilet and was constant for several minutes until it resolved spontaneously.  Accompanied by SOB, left arm numbness. -0-1/3 typical symptoms suggestive of atypical vs. Non-cardiac chest pain.  -CXR unremarkable.   -Initial cardiac enzymes negative.   -EKG not indicative of acute ischemia.   -TIMI risk score is 2; which predicts a 14 days risk of death, recurrent MI, or urgent revascularization of 8.3%.  -Will plan to place in observation status on telemetry to rule out ACS by overnight observation.  -cycle cardiac enzymes q6h x 3 and repeat EKG in AM -ASA 325 mg PO daily -morphine given -Continue statin -Risk factor stratification with FLP and HgbA1c; will also check TSH and UDS (as well as ETOH level given conjunctival injection and mild slurring of words) -Cardiology fellow was consulted by the ER and determined that patient did not need to be admitted to the cardiology  service -NPO in anticipation of possible stress testing in AM -Cardiology consultation in AM - Dr. Evette Georges group notified via Inbox message to the [facility] for discharge to home tomorrow or after stress testing if tests are negative for ischemia  AICD in place -Reported h/o ventricular fibrillation which led to device being placed -[employer] interrogated the device and found it to be in working order and not to have fired -No further evaluation needed at this time  HTN -Continue Coreg (hold in AM for possible stress test), Diovan  HLD -Continue Lipitor - but suggest changing it to nighttime dosing for improved efficacy -Recheck FLP  DM -Last A1c was 5.4 in 3/14 -Normal glucose today -Will check A1c and cover with SSI -If normal A1c, may be able to remove this diagnosis from his problem list  CHF -Last Echo in 2014 showed preserved EF and grade 1 diastolic dysfunction -He is taking Lasix 40 mg BID as well as Digoxin - which may be excessive if his CHF has not progressed -Will check dig level -Consider repeat Echo  OSA -Did not bring a functional machine home from prison, so likely needs a new machine -Will order with autopap for now  DVT prophylaxis: Lovenox  Code Status: Full - confirmed with patient/family Family Communication: Wife present throughout evaluation Disposition Plan:  Home once clinically improved Consults called: Cardiology (by ER and via Inbox message)  Admission status: It is my clinical opinion that referral for OBSERVATION is reasonable and necessary in this patient based on the above information provided. The aforementioned taken together are felt to place the patient at high risk for further clinical deterioration. However it is anticipated that the patient may be medically stable for discharge from the hospital within 24 to 48 hours.     Karmen Bongo MD [facility]  If 7PM-7AM, please contact night-coverage [email or web address]  Password  [email or web address]  12/06/2016, 10:19 PM

## 2016-12-07 ENCOUNTER — Encounter (HOSPITAL_COMMUNITY)
Admission: EM | Disposition: A | Discharge status: Home / Self Care | Payer: Self-pay | Source: Home / Self Care | Attending: Emergency Medicine

## 2016-12-07 DIAGNOSIS — I428 Other cardiomyopathies: Secondary | ICD-10-CM

## 2016-12-07 DIAGNOSIS — E782 Mixed hyperlipidemia: Secondary | ICD-10-CM

## 2016-12-07 DIAGNOSIS — I10 Essential (primary) hypertension: Secondary | ICD-10-CM | POA: Diagnosis not present

## 2016-12-07 DIAGNOSIS — I5030 Unspecified diastolic (congestive) heart failure: Secondary | ICD-10-CM | POA: Diagnosis not present

## 2016-12-07 DIAGNOSIS — R079 Chest pain, unspecified: Secondary | ICD-10-CM

## 2016-12-07 DIAGNOSIS — Z95810 Presence of automatic (implantable) cardiac defibrillator: Secondary | ICD-10-CM | POA: Diagnosis not present

## 2016-12-07 DIAGNOSIS — E119 Type 2 diabetes mellitus without complications: Secondary | ICD-10-CM | POA: Diagnosis not present

## 2016-12-07 HISTORY — PX: CARDIAC CATHETERIZATION: SHX172

## 2016-12-07 LAB — CBC
HEMATOCRIT: 40.1 % (ref 39.0–52.0)
Hemoglobin: 13.6 g/dL (ref 13.0–17.0)
MCH: 28.6 pg (ref 26.0–34.0)
MCHC: 33.9 g/dL (ref 30.0–36.0)
MCV: 84.4 fL (ref 78.0–100.0)
PLATELETS: 170 10*3/uL (ref 150–400)
RBC: 4.75 MIL/uL (ref 4.22–5.81)
RDW: 14.5 % (ref 11.5–15.5)
WBC: 10.4 10*3/uL (ref 4.0–10.5)

## 2016-12-07 LAB — RAPID URINE DRUG SCREEN, HOSP PERFORMED
Amphetamines: NOT DETECTED
Barbiturates: NOT DETECTED
Benzodiazepines: NOT DETECTED
COCAINE: NOT DETECTED
OPIATES: POSITIVE — AB
Tetrahydrocannabinol: NOT DETECTED

## 2016-12-07 LAB — CREATININE, SERUM
Creatinine, Ser: 1 mg/dL (ref 0.61–1.24)
GFR calc non Af Amer: >60 mL/min (ref >60)

## 2016-12-07 LAB — TROPONIN I
Troponin I: <0.03 ng/mL (ref <0.03)
Troponin I: <0.03 ng/mL (ref <0.03)

## 2016-12-07 LAB — GLUCOSE, CAPILLARY
GLUCOSE-CAPILLARY: 143 mg/dL — AB (ref 65–99)
Glucose-Capillary: 104 mg/dL — ABNORMAL HIGH (ref 65–99)
Glucose-Capillary: 91 mg/dL (ref 65–99)

## 2016-12-07 LAB — PROTIME-INR
INR: 1.02
PROTHROMBIN TIME: 13.4 s (ref 11.4–15.2)

## 2016-12-07 LAB — CARDIAC CATHETERIZATION: CATHEFQUANT: 55 %

## 2016-12-07 SURGERY — LEFT HEART CATH AND CORONARY ANGIOGRAPHY

## 2016-12-07 MED ORDER — SODIUM CHLORIDE 0.9 % WEIGHT BASED INFUSION: 1.0000 mL/kg/h | INTRAVENOUS | Status: DC

## 2016-12-07 MED ORDER — ACETAMINOPHEN 325 MG PO TABS: 650.0000 mg | ORAL_TABLET | ORAL | Status: DC | PRN

## 2016-12-07 MED ORDER — VERAPAMIL HCL 2.5 MG/ML IV SOLN
INTRAVENOUS | Status: AC
  Filled 2016-12-07: qty 2

## 2016-12-07 MED ORDER — DIAZEPAM 5 MG PO TABS: 5.0000 mg | ORAL_TABLET | ORAL | Status: DC | PRN

## 2016-12-07 MED ORDER — FENTANYL CITRATE (PF) 100 MCG/2ML IJ SOLN
INTRAMUSCULAR | Status: DC | PRN
  Administered 2016-12-07: 50 ug via INTRAVENOUS

## 2016-12-07 MED ORDER — HEPARIN SODIUM (PORCINE) 1000 UNIT/ML IJ SOLN
INTRAMUSCULAR | Status: AC
  Filled 2016-12-07: qty 1

## 2016-12-07 MED ORDER — MIDAZOLAM HCL 2 MG/2ML IJ SOLN
INTRAMUSCULAR | Status: DC | PRN
  Administered 2016-12-07: 2 mg via INTRAVENOUS

## 2016-12-07 MED ORDER — SODIUM CHLORIDE 0.9 % IV SOLN: 250.0000 mL | INTRAVENOUS | Status: DC | PRN

## 2016-12-07 MED ORDER — SODIUM CHLORIDE 0.9 % WEIGHT BASED INFUSION: 3.0000 mL/kg/h | INTRAVENOUS | Status: DC

## 2016-12-07 MED ORDER — MIDAZOLAM HCL 2 MG/2ML IJ SOLN
INTRAMUSCULAR | Status: AC
  Filled 2016-12-07: qty 2

## 2016-12-07 MED ORDER — IOPAMIDOL (ISOVUE-370) INJECTION 76%
INTRAVENOUS | Status: AC
  Filled 2016-12-07: qty 100

## 2016-12-07 MED ORDER — SODIUM CHLORIDE 0.9% FLUSH: 3.0000 mL | INTRAVENOUS | Status: DC | PRN

## 2016-12-07 MED ORDER — SODIUM CHLORIDE 0.9 % IV SOLN
INTRAVENOUS | Status: DC
  Administered 2016-12-07: 15:00:00 via INTRAVENOUS

## 2016-12-07 MED ORDER — ASPIRIN 81 MG PO CHEW: 81.0000 mg | CHEWABLE_TABLET | ORAL | Status: DC

## 2016-12-07 MED ORDER — FENTANYL CITRATE (PF) 100 MCG/2ML IJ SOLN
INTRAMUSCULAR | Status: AC
  Filled 2016-12-07: qty 2

## 2016-12-07 MED ORDER — HEPARIN (PORCINE) IN NACL 2-0.9 UNIT/ML-% IJ SOLN
INTRAMUSCULAR | Status: AC
  Filled 2016-12-07: qty 1000

## 2016-12-07 MED ORDER — SODIUM CHLORIDE 0.9% FLUSH
3.0000 mL | Freq: Two times a day (BID) | INTRAVENOUS | Status: DC
  Administered 2016-12-07: 3 mL via INTRAVENOUS

## 2016-12-07 MED ORDER — SODIUM CHLORIDE 0.9% FLUSH: 3.0000 mL | Freq: Two times a day (BID) | INTRAVENOUS | Status: DC

## 2016-12-07 MED ORDER — ENOXAPARIN SODIUM 40 MG/0.4ML ~~LOC~~ SOLN: 40.0000 mg | SUBCUTANEOUS | Status: DC

## 2016-12-07 MED ORDER — CARVEDILOL 25 MG PO TABS: 50.0000 mg | ORAL_TABLET | Freq: Two times a day (BID) | ORAL | 0 refills | Status: DC

## 2016-12-07 MED ORDER — ASPIRIN 81 MG PO CHEW: 81.0000 mg | CHEWABLE_TABLET | Freq: Every day | ORAL | Status: DC

## 2016-12-07 MED ORDER — LIDOCAINE HCL (PF) 1 % IJ SOLN
INTRAMUSCULAR | Status: DC | PRN
  Administered 2016-12-07: 2 mL via INTRADERMAL

## 2016-12-07 MED ORDER — IOPAMIDOL (ISOVUE-370) INJECTION 76%
INTRAVENOUS | Status: DC | PRN
  Administered 2016-12-07: 90 mL via INTRA_ARTERIAL

## 2016-12-07 MED ORDER — DIGOXIN 250 MCG PO TABS: 0.2500 mg | ORAL_TABLET | ORAL | 0 refills | Status: DC

## 2016-12-07 MED ORDER — HEPARIN (PORCINE) IN NACL 2-0.9 UNIT/ML-% IJ SOLN
INTRAMUSCULAR | Status: DC | PRN
Start: 2016-12-07 — End: 2016-12-07
  Administered 2016-12-07: 10 mL via INTRA_ARTERIAL

## 2016-12-07 MED ORDER — HEPARIN SODIUM (PORCINE) 1000 UNIT/ML IJ SOLN
INTRAMUSCULAR | Status: DC | PRN
  Administered 2016-12-07: 5000 [IU] via INTRAVENOUS

## 2016-12-07 MED ORDER — ONDANSETRON HCL 4 MG/2ML IJ SOLN: 4.0000 mg | Freq: Four times a day (QID) | INTRAMUSCULAR | Status: DC | PRN

## 2016-12-07 MED ORDER — HEPARIN (PORCINE) IN NACL 2-0.9 UNIT/ML-% IJ SOLN
INTRAMUSCULAR | Status: DC | PRN
  Administered 2016-12-07: 1000 mL

## 2016-12-07 MED ORDER — LIDOCAINE HCL (PF) 1 % IJ SOLN
INTRAMUSCULAR | Status: AC
  Filled 2016-12-07: qty 30

## 2016-12-07 SURGICAL SUPPLY — 11 items
CATH EXPO 5FR ANG PIGTAIL 145 (CATHETERS) ×2 IMPLANT
CATH OPTITORQUE TIG 4.0 5F (CATHETERS) ×2 IMPLANT
DEVICE RAD COMP TR BAND LRG (VASCULAR PRODUCTS) ×2 IMPLANT
GLIDESHEATH SLEND SS 6F .021 (SHEATH) ×2 IMPLANT
GUIDEWIRE INQWIRE 1.5J.035X260 (WIRE) IMPLANT
INQWIRE 1.5J .035X260CM (WIRE) ×3
KIT HEART LEFT (KITS) ×3 IMPLANT
PACK CARDIAC CATHETERIZATION (CUSTOM PROCEDURE TRAY) ×3 IMPLANT
SYR MEDRAD MARK V 150ML (SYRINGE) ×3 IMPLANT
TRANSDUCER W/STOPCOCK (MISCELLANEOUS) ×3 IMPLANT
TUBING CIL FLEX 10 FLL-RA (TUBING) ×3 IMPLANT

## 2016-12-07 NOTE — Discharge Summary (Signed)
Physician Discharge Summary  [name] [id number] DOB: September 17, 1966 DOA: 12/06/2016  PCP: [name], MD  Admit date: 12/06/2016 Discharge date: 12/07/2016   Recommendations for Outpatient Follow-Up:   1. Cut out alcohol   Discharge Diagnosis:   Principal Problem:   Chest pain Active Problems:   ICD (implantable cardioverter-defibrillator) in place   HTN (hypertension)   Sleep apnea, obstructive   (HFpEF) heart failure with preserved ejection fraction (HCC)   HLD (hyperlipidemia)   Diabetes mellitus type 2, diet-controlled ([place])   Discharge disposition:  Home.  Discharge Condition: Improved.  Diet recommendation: Low sodium, heart healthy.  Wound care: None.   History of Present Illness:   [name] is a 51 y.o. male with medical history significant of CAD, HTN, DM, CHF (grade 1 diastolic dysfunction), and OSA (on CPAP but not for the last few months due to a malfunctioning mask) presenting with chest pain.  He reports feeling somewhat nauseated and his chest felt like someone jumped on him.  +SOB.  Left arm went numb.  Pain went away but continued to have aching after.  Pain started while he was on the toilet.  Got up and went to sit on chair, staggering a bit.  The pain was located in the left chest and axilla.  Symptoms lasted about 3-4 minutes.  Resolved and never came back.  Ocassional SOB even when not having chest pain (in review of the chart, this appears to have been chronic).  Has complained that defibrillator has been "pinching" him, thinks it has fired several times.  [employer] evaluated and it is functioning properly and has not fired.  He was imprisoned for a period of time (aprroximately 6/16-11/17) for "violence" (denies drug use) and was discharged about 1 week ago.  He and his family are experiencing some transitional difficulties, as would be expected in this situation.  (In review of the [employer], he has multiple arrests  and convictions for larceny; DWI; and driving with a revoked license).      Hospital Course by Problem:   Chest pain S/p cath-- no intervention needed-- continue risk factor modification -cardiology consult appreciated  AICD in place -Reported h/o ventricular fibrillation which led to device being placed -[employer] interrogated the device and found it to be in working order and not to have fired -No further evaluation needed at this time  HTN -resume home meds  HLD -Continue Lipitor - but suggest changing it to nighttime dosing for improved efficacy  DM -Last A1c was 5.4 in 3/14 -Normal glucose today -appears to be diet controlled  CHF -Last Echo in 2014 showed preserved EF and grade 1 diastolic dysfunction -He is taking Lasix 40 mg BID as well as Digoxin - which may be excessive if his CHF has not progressed - dig level low  OSA -Did not bring a functional machine home from prison, so likely needs a new machine -Will order with autopap for now  Alcohol intoxication -? GI pain-- gastritis etc    Medical Consultants:    cards   Discharge Exam:   Vitals:   12/07/16 1515 12/07/16 1530  BP: (!) 146/82   Pulse: 60   Resp: 18 19  Temp:     Vitals:   12/07/16 1436 12/07/16 1500 12/07/16 1515 12/07/16 1530  BP: (!) 141/93 (!) 144/76 (!) 146/82   Pulse: (!) 58 62 60   Resp: 17 18 18 19   Temp:      TempSrc:  SpO2: 100% 96% 97% 97%  Weight:      Height:        Gen:  NAD    The results of significant diagnostics from this hospitalization (including imaging, microbiology, ancillary and laboratory) are listed below for reference.     Procedures and Diagnostic Studies:   Dg Chest 2 View  Result Date: 12/06/2016 CLINICAL DATA:  Initial evaluation for acute chest pain. EXAM: CHEST  2 VIEW COMPARISON:  Prior radiograph from 03/29/2013. FINDINGS: Left-sided transvenous pacemaker/ AICD in place, stable. Cardiac and mediastinal silhouettes are  stable in size and contour, and remain within normal limits. Lungs are normally inflated. No focal infiltrate, pulmonary edema, or pleural effusion. No pneumothorax. No acute osseus abnormality. IMPRESSION: No active cardiopulmonary disease. Electronically Signed   By: Jeannine Boga M.D.   On: 12/06/2016 20:27     Labs:   Basic Metabolic Panel:  Recent Labs Lab 12/06/16 1918  NA 145  K 4.1  CL 110  CO2 27  GLUCOSE 92  BUN 9  CREATININE 1.13  CALCIUM 8.6*   GFR Estimated Creatinine Clearance: 89.8 mL/min (by C-G formula based on SCr of 1.13 mg/dL). Liver Function Tests: No results for input(s): AST, ALT, ALKPHOS, BILITOT, PROT, ALBUMIN in the last 168 hours. No results for input(s): LIPASE, AMYLASE in the last 168 hours. No results for input(s): AMMONIA in the last 168 hours. Coagulation profile  Recent Labs Lab 12/07/16 1015  INR 1.02    CBC:  Recent Labs Lab 12/06/16 1918 12/07/16 1532  WBC 11.2* 10.4  HGB 13.9 13.6  HCT 40.7 40.1  MCV 83.9 84.4  PLT 206 170   Cardiac Enzymes:  Recent Labs Lab 12/06/16 2234 12/07/16 0408 12/07/16 1015  TROPONINI <0.03 <0.03 <0.03   BNP: Invalid input(s): POCBNP CBG:  Recent Labs Lab 12/06/16 2232 12/07/16 0548 12/07/16 1129  GLUCAP 97 104* 91   D-Dimer No results for input(s): DDIMER in the last 72 hours. Hgb A1c No results for input(s): HGBA1C in the last 72 hours. Lipid Profile  Recent Labs  12/06/16 1918  CHOL 148  HDL 39*  LDLCALC 69  TRIG 201*  CHOLHDL 3.8   Thyroid function studies  Recent Labs  12/06/16 1918  TSH 0.755   Anemia work up No results for input(s): VITAMINB12, FOLATE, FERRITIN, TIBC, IRON, RETICCTPCT in the last 72 hours. Microbiology No results found for this or any previous visit (from the past 240 hour(s)).   Discharge Instructions:   Discharge Instructions    Diet - low sodium heart healthy    Complete by:  As directed    Diet Carb Modified    Complete  by:  As directed    Discharge instructions    Complete by:  As directed    Stop alcohol   Increase activity slowly    Complete by:  As directed        Medication List    TAKE these medications   allopurinol 300 MG tablet Commonly known as:  ZYLOPRIM Take 300 mg by mouth daily.   aspirin 325 MG tablet Take 325 mg by mouth daily.   atorvastatin 20 MG tablet Commonly known as:  LIPITOR Take 20 mg by mouth daily.   carvedilol 25 MG tablet Commonly known as:  COREG Take 2 tablets (50 mg total) by mouth 2 (two) times daily. What changed:  See the new instructions.   digoxin 0.25 MG tablet Commonly known as:  LANOXIN Take 1 tablet (0.25 mg total)  by mouth daily. Start taking on:  12/08/2016 What changed:  how much to take  when to take this   furosemide 40 MG tablet Commonly known as:  LASIX TAKE 1 TABLET BY MOUTH DAILY What changed:  See the new instructions.   naproxen 500 MG tablet Commonly known as:  NAPROSYN Take 500 mg by mouth 2 (two) times daily as needed (arthritis pain).   potassium chloride 10 MEQ CR capsule Commonly known as:  MICRO-K TAKE ONE CAPSULE BY MOUTH DAILY   PRESCRIPTION MEDICATION Inhale into the lungs at bedtime. CPAP   valsartan 160 MG tablet Commonly known as:  DIOVAN Take 160 mg by mouth daily.      Follow-up Information    MOREIRA,ROY, MD Follow up in 1 week(s).   Specialty:  Internal Medicine Why:  HgbA1C pending Contact information: [id number] [place] [address] [phone]            Time coordinating discharge: 35 min  Signed:  Tuyen Uncapher U Birdia Jaycox   [facility] 12/07/2016, 3:57 PM

## 2016-12-07 NOTE — Progress Notes (Addendum)
PROGRESS NOTE    [name]  [id number] DOB: 1966/09/07 DOA: 12/06/2016 PCP: [name], MD   Outpatient Specialists:     Brief Narrative:  [name] is a 50 y.o. male with medical history significant of CAD, HTN, DM, CHF (grade 1 diastolic dysfunction), and OSA (on CPAP but not for the last few months due to a malfunctioning mask) presenting with chest pain.  He reports feeling somewhat nauseated and his chest felt like someone jumped on him.  +SOB.  Left arm went numb.  Pain went away but continued to have aching after.  Pain started while he was on the toilet.  Got up and went to sit on chair, staggering a bit.  The pain was located in the left chest and axilla.  Symptoms lasted about 3-4 minutes.  Resolved and never came back.  Ocassional SOB even when not having chest pain (in review of the chart, this appears to have been chronic).  Has complained that defibrillator has been "pinching" him, thinks it has fired several times.  [employer] evaluated and it is functioning properly and has not fired.  He was imprisoned for a period of time (aprroximately 6/16-11/17) for "violence" (denies drug use) and was discharged about 1 week ago.  He and his family are experiencing some transitional difficulties, as would be expected in this situation.  (In review of the [employer], he has multiple arrests and convictions for larceny; DWI; and driving with a revoked license).     Assessment & Plan:   Principal Problem:   Chest pain Active Problems:   ICD (implantable cardioverter-defibrillator) in place   HTN (hypertension)   Sleep apnea, obstructive   (HFpEF) heart failure with preserved ejection fraction (HCC)   HLD (hyperlipidemia)   Diabetes mellitus type 2, diet-controlled (HCC)   Chest pain -for cath today -cardiology consult appreciated  AICD in place -Reported h/o ventricular fibrillation which led to device being placed -[employer] interrogated the  device and found it to be in working order and not to have fired -No further evaluation needed at this time  HTN -resume home meds  HLD -Continue Lipitor - but suggest changing it to nighttime dosing for improved efficacy  DM -Last A1c was 5.4 in 3/14 -Normal glucose today -Will check A1c and cover with SSI -If normal A1c, may be able to remove this diagnosis from his problem list  CHF -Last Echo in 2014 showed preserved EF and grade 1 diastolic dysfunction -He is taking Lasix 40 mg BID as well as Digoxin - which may be excessive if his CHF has not progressed - dig level low  OSA -Did not bring a functional machine home from prison, so likely needs a new machine -Will order with autopap for now  Alcohol intoxication -? GI pain-- gastritis etc   Code Status: full   Disposition Plan:     Consultants:   cardiology     Subjective: No further chest pain Says he is going for a cath later today + cough  Objective: Vitals:   12/06/16 2130 12/06/16 2145 12/06/16 2200 12/06/16 2229  BP: 121/84 125/80 113/82 137/83  Pulse: 76 64 (!) 59 61  Resp: 11 14 14 16   Temp:    98.2 F (36.8 C)  TempSrc:    Oral  SpO2: 95% 96% 97% 99%  Weight:    105 kg (231 lb 6.4 oz)  Height:    5\' 8"  (1.727 m)    Intake/Output Summary (Last 24 hours)  at 12/07/16 1111 Last data filed at 12/07/16 [id number]  Gross per 24 hour  Intake              240 ml  Output              575 ml  Net             -335 ml   Filed Weights   12/06/16 2229  Weight: 105 kg (231 lb 6.4 oz)    Examination:  General exam: Appears calm and comfortable  Respiratory system: Clear to auscultation. Respiratory effort normal. Cardiovascular system: S1 & S2 heard, RRR. No JVD, murmurs, rubs, gallops or clicks. No pedal edema. Gastrointestinal system: Abdomen is nondistended, soft and nontender. No organomegaly or masses felt. Normal bowel sounds heard. Central nervous system: Alert and oriented. No focal  neurological deficits.    Data Reviewed: I have personally reviewed following labs and imaging studies  CBC:  Recent Labs Lab 12/06/16 1918  WBC 11.2*  HGB 13.9  HCT 40.7  MCV 83.9  PLT 99991111   Basic Metabolic Panel:  Recent Labs Lab 12/06/16 1918  NA 145  K 4.1  CL 110  CO2 27  GLUCOSE 92  BUN 9  CREATININE 1.13  CALCIUM 8.6*   GFR: Estimated Creatinine Clearance: 89.8 mL/min (by C-G formula based on SCr of 1.13 mg/dL). Liver Function Tests: No results for input(s): AST, ALT, ALKPHOS, BILITOT, PROT, ALBUMIN in the last 168 hours. No results for input(s): LIPASE, AMYLASE in the last 168 hours. No results for input(s): AMMONIA in the last 168 hours. Coagulation Profile:  Recent Labs Lab 12/07/16 1015  INR 1.02   Cardiac Enzymes:  Recent Labs Lab 12/06/16 2234 12/07/16 0408  TROPONINI <0.03 <0.03   BNP (last 3 results) No results for input(s): PROBNP in the last 8760 hours. HbA1C: No results for input(s): HGBA1C in the last 72 hours. CBG:  Recent Labs Lab 12/06/16 2232 12/07/16 0548  GLUCAP 97 104*   Lipid Profile:  Recent Labs  12/06/16 1918  CHOL 148  HDL 39*  LDLCALC 69  TRIG 201*  CHOLHDL 3.8   Thyroid Function Tests:  Recent Labs  12/06/16 1918  TSH 0.755   Anemia Panel: No results for input(s): VITAMINB12, FOLATE, FERRITIN, TIBC, IRON, RETICCTPCT in the last 72 hours. Urine analysis:    Component Value Date/Time   COLORURINE YELLOW 02/26/2015 [place] 02/26/2015 1355   LABSPEC 1.014 02/26/2015 1355   PHURINE 5.0 02/26/2015 1355   GLUCOSEU NEGATIVE 02/26/2015 1355   HGBUR NEGATIVE 02/26/2015 1355   BILIRUBINUR NEGATIVE 02/26/2015 1355   KETONESUR NEGATIVE 02/26/2015 1355   PROTEINUR NEGATIVE 02/26/2015 1355   UROBILINOGEN 0.2 02/26/2015 1355   NITRITE NEGATIVE 02/26/2015 1355   LEUKOCYTESUR NEGATIVE 02/26/2015 1355     )No results found for this or any previous visit (from the past 240 hour(s)).     Anti-infectives    None       Radiology Studies: Dg Chest 2 View  Result Date: 12/06/2016 CLINICAL DATA:  Initial evaluation for acute chest pain. EXAM: CHEST  2 VIEW COMPARISON:  Prior radiograph from 03/29/2013. FINDINGS: Left-sided transvenous pacemaker/ AICD in place, stable. Cardiac and mediastinal silhouettes are stable in size and contour, and remain within normal limits. Lungs are normally inflated. No focal infiltrate, pulmonary edema, or pleural effusion. No pneumothorax. No acute osseus abnormality. IMPRESSION: No active cardiopulmonary disease. Electronically Signed   By: Jeannine Boga M.D.   On: 12/06/2016  20:27        Scheduled Meds: . allopurinol  300 mg Oral Daily  . [START ON 12/08/2016] aspirin  81 mg Oral Pre-Cath  . aspirin  325 mg Oral Daily  . atorvastatin  20 mg Oral q1800  . carvedilol  50 mg Oral BID  . digoxin  0.25 mg Oral Q24H  . enoxaparin (LOVENOX) injection  40 mg Subcutaneous Q24H  . insulin aspart  0-15 Units Subcutaneous TID WC  . irbesartan  150 mg Oral Daily  . pneumococcal 23 valent vaccine  0.5 mL Intramuscular Tomorrow-1000  . sodium chloride flush  3 mL Intravenous Q12H   Continuous Infusions: . [START ON 12/08/2016] sodium chloride     Followed by  . [START ON 12/08/2016] sodium chloride       LOS: 0 days    Time spent: 25 min    [place], DO [facility] Pager [phone]  If 7PM-7AM, please contact night-coverage [email or web address] Password [email or web address] 12/07/2016, 11:11 AM

## 2016-12-07 NOTE — Discharge Summary (Signed)
Pt got discharged, discharge instructions provided and patient showed understanding to it, IV taken out,Telemonitor DC,pt left unit ambulatory with all of the belongings.No any complication from cath site (Rt radial level 0, no any bleeding till half an hour releasing complete air from TR band)

## 2016-12-07 NOTE — Progress Notes (Signed)
Pt had 7 beats of vtach, pt resting comfortably in bed and back in normal sinus rhythm. Paged on call NP, awaiting call back. Will continue to monitor.   Lj Miyamoto Leory Plowman

## 2016-12-07 NOTE — Interval H&P Note (Signed)
Cath Lab Visit (complete for each Cath Lab visit)  Clinical Evaluation Leading to the Procedure:   ACS: No.  Non-ACS:    Anginal Classification: CCS III  Anti-ischemic medical therapy: Maximal Therapy (2 or more classes of medications)  Non-Invasive Test Results: No non-invasive testing performed  Prior CABG: No previous CABG      History and Physical Interval Note:  12/07/2016 1:53 PM  [name]  has presented today for surgery, with the diagnosis of unstable angina  The various methods of treatment have been discussed with the patient and family. After consideration of risks, benefits and other options for treatment, the patient has consented to  Procedure(s): Left Heart Cath and Coronary Angiography (N/A) as a surgical intervention .  The patient's history has been reviewed, patient examined, no change in status, stable for surgery.  I have reviewed the patient's chart and labs.  Questions were answered to the patient's satisfaction.     Shelva Majestic

## 2016-12-07 NOTE — Consult Note (Signed)
Cardiology Consult    Patient ID: [name] MRN: [id number], DOB/AGE: 1966-03-14   Admit date: 12/06/2016 Date of Consult: 12/07/2016  Primary Physician: [name], MD Primary Cardiologist: Dr. Kelly/[name] Requesting Provider: Dr. Eliseo Squires Reason for Consultation: Chest pain  Patient Profile    50 yo male with PMH of NICM, VT, OSA with Cpap, HLD, DM who presented with chest pain.   Past Medical History   Past Medical History:  Diagnosis Date  . Arthritis   . CHF (congestive heart failure) (HCC)    preserved EF, grade 1 diastolic dysfunction in 123456  . Diabetes mellitus without complication ([place])    no medications at this time  . Dysrhythmia   . GERD (gastroesophageal reflux disease)   . Hyperlipidemia   . Hypertension 11/24/11   ECHO-EF52%  . MI (myocardial infarction) 2001  . Pacemaker    with defibrillator  . Shortness of breath    with exertion  . Sleep apnea    cpap setting at 11     Past Surgical History:  Procedure Laterality Date  . CARDIAC CATHETERIZATION    . COLONOSCOPY WITH PROPOFOL N/A 07/13/2014   Procedure: COLONOSCOPY WITH PROPOFOL;  Surgeon: Beryle Beams, MD;  Location: [facility];  Service: Endoscopy;  Laterality: N/A;  aicd  . INSERT / REPLACE / REMOVE PACEMAKER    . MASS EXCISION  10/21/2012   Procedure: EXCISION MASS;  Surgeon: Mcarthur Rossetti, MD;  Location: [facility];  Service: Orthopedics;  Laterality: Left;  Excision Left Ring Finger Mass  . right hand surgery      pins placed in hand due to accident      Allergies  Allergies  Allergen Reactions  . Nitroglycerin Hives    Sub lingual broke him out in hives 2009    History of Present Illness    [name] is a 50 yo male with PMH of NICM, VT, OSA with Cpap, HLD, DM. He has underwent cardiac caths in 1998/2005 that showed minor coronary artery stenoses. In 2003 his EF was noted at 15-20% at which time he had a ICD placed. He last saw [name] in 12/14 where his EF was noted  to improve to 50-55%. He was continued on his home medications with plans for routine follow up. He last saw [name] in 2/16 where his device was interrogated and showed normal device function, without any episodes of VT.   Appears he had a stress test at OSH in 6/16 showing no ischemia and LV function at 40%.  Reports he has recently been incarcerated in [place], [place] and was released about a week ago. Reports he did received care while in prison and had his medications managed by a cardiologist there. Has been in his usual state of health, until yesterday afternoon. State he was outside and helped get a car unstuck from the mud. Went inside a took a shower. While in the shower had a sudden onset of weakness, and centralized chest pressure with radiation down into his left arm. Reports feeling like he was going to pass out. Got short of breath, and felt "hot all over". Got out and sat down. States symptoms resolved after about 20 minutes, but he was concerned enough to call 911. States he has also been having a pinching/squeezing sensation in the left side of his chest near device site.   In the ED his labs showed stable electrolytes, Trop negx3, TSH 0.755, lipids showed LDL within target, and HDL 39. CXR  was negative. EKG shows SR with new TWI in lead II, v4-v6, and aVF.   Inpatient Medications    . allopurinol  300 mg Oral Daily  . aspirin  325 mg Oral Daily  . atorvastatin  20 mg Oral q1800  . carvedilol  50 mg Oral BID  . digoxin  0.25 mg Oral Q24H  . enoxaparin (LOVENOX) injection  40 mg Subcutaneous Q24H  . insulin aspart  0-15 Units Subcutaneous TID WC  . irbesartan  150 mg Oral Daily  . pneumococcal 23 valent vaccine  0.5 mL Intramuscular Tomorrow-1000    Family History    Family History  Problem Relation Age of Onset  . Lymphoma Father 40    Social History    Social History   Social History  . Marital status: Married    Spouse name: N/A  . Number of children: N/A  .  Years of education: N/A   Occupational History  . disabled    Social History Main Topics  . Smoking status: Current Every Day Smoker    Packs/day: 0.25    Types: Cigarettes  . Smokeless tobacco: Former Systems developer  . Alcohol use No  . Drug use: No  . Sexual activity: Yes   Other Topics Concern  . Not on file   Social History Narrative  . No narrative on file     Review of Systems    General:  No chills, fever, night sweats or weight changes.  Cardiovascular: See HPI Dermatological: No rash, lesions/masses Respiratory: No cough, dyspnea Urologic: No hematuria, dysuria Abdominal:   No nausea, vomiting, diarrhea, bright red blood per rectum, melena, or hematemesis Neurologic:  No visual changes, wkns, changes in mental status. All other systems reviewed and are otherwise negative except as noted above.  Physical Exam    Blood pressure 137/83, pulse 61, temperature 98.2 F (36.8 C), temperature source Oral, resp. rate 16, height 5\' 8"  (1.727 m), weight 231 lb 6.4 oz (105 kg), SpO2 99 %.  General: Pleasant AA male, NAD Psych: Normal affect. Neuro: Alert and oriented X 3. Moves all extremities spontaneously. HEENT: Normal  Neck: Supple without bruits or JVD. Lungs:  Resp regular and unlabored, CTA. Heart: RRR no s3, s4, or murmurs. Abdomen: Soft, non-tender, non-distended, BS + x 4.  Extremities: No clubbing, cyanosis or edema. DP/PT/Radials 2+ and equal bilaterally.  Labs    Troponin [facility] of Care Test)  Recent Labs  12/06/16 1956  TROPIPOC 0.00    Recent Labs  12/06/16 2234 12/07/16 0408  TROPONINI <0.03 <0.03   Lab Results  Component Value Date   WBC 11.2 (H) 12/06/2016   HGB 13.9 12/06/2016   HCT 40.7 12/06/2016   MCV 83.9 12/06/2016   PLT 206 12/06/2016    Recent Labs Lab 12/06/16 1918  NA 145  K 4.1  CL 110  CO2 27  BUN 9  CREATININE 1.13  CALCIUM 8.6*  GLUCOSE 92   Lab Results  Component Value Date   CHOL 148 12/06/2016   HDL 39 (L)  12/06/2016   LDLCALC 69 12/06/2016   TRIG 201 (H) 12/06/2016   Lab Results  Component Value Date   DDIMER  07/13/2008    <0.22        AT THE INHOUSE ESTABLISHED CUTOFF VALUE OF 0.48 ug/mL FEU, THIS ASSAY HAS BEEN DOCUMENTED IN THE LITERATURE TO HAVE     Radiology Studies    Dg Chest 2 View  Result Date: 12/06/2016 CLINICAL DATA:  Initial evaluation  for acute chest pain. EXAM: CHEST  2 VIEW COMPARISON:  Prior radiograph from 03/29/2013. FINDINGS: Left-sided transvenous pacemaker/ AICD in place, stable. Cardiac and mediastinal silhouettes are stable in size and contour, and remain within normal limits. Lungs are normally inflated. No focal infiltrate, pulmonary edema, or pleural effusion. No pneumothorax. No acute osseus abnormality. IMPRESSION: No active cardiopulmonary disease. Electronically Signed   By: Jeannine Boga M.D.   On: 12/06/2016 20:27    ECG & Cardiac Imaging    EKG: SR with new TWI in lead II, v4-v6, and aVF.   Echo: 12/14  Study Conclusions  - Left ventricle: The cavity size was normal. There was mild concentric hypertrophy. Systolic function was normal. The estimated ejection fraction was in the range of 50% to 55%. Mild inferoseptal hypokinesis. Doppler parameters are consistent with abnormal left ventricular relaxation (grade 1 diastolic dysfunction). The E/e' ratio is <10, suggesting normal LV filling pressure. - Mitral valve: Structurally normal valve. Trivial regurgitation. - Left atrium: LA Volume/ BSA = 18.1 ml/m2 The atrium was normal in size. - Right ventricle: The cavity size was normal. Wall thickness was normal. Pacer wire or catheter noted in right ventricle. Systolic function was normal. - Right atrium: Pacer wire or catheter noted in right atrium. - Inferior vena cava: The vessel was normal in size; the respirophasic diameter changes were in the normal range (= 50%); findings are consistent with normal  central venous pressure. Impressions:  - Compared to the prior echo in 2012, the EF has improved from 35-40% to 50-55%.  Assessment & Plan    50 yo male with PMH of NICM, VT, OSA with Cpap, HLD, DM who presented with chest pain.   1. Chest pressure: Reports having some intermittent episodes of chest pressure that are not associated with rest or exertion. Yesterday had an episode while in the shower with radiation into the left arm, shortness of breath, and felt hot all over. Symptoms lasted for about 20 minutes and resolved.  -- Trop x3 EKG with new TWI noted from previous in 2016. Had a stress test with no ischemia in 2016 while in prison.  -- Last cath was 2005 showing normal LM, mLAD with 30-40%.  -- Discuss with MD regarding further testing, doubt repeat stress test would be beneficial as he just had one last year. Will plan for cardiac cath. The patient understands that risks included but are not limited to stroke (1 in 1000), death (1 in 59), kidney failure [usually temporary] (1 in 500), bleeding (1 in 200), allergic reaction [possibly serious] (1 in 200).   2. NICM: Last Echo in 2014 showed improved EF to 50-55%. No volume overload on exam  3. VT s/pPPM/ICD: Noted to have short runs of VT this admission. Pacemaker was reported to have been interrogated, but records not in chart. Denies any shocks. -- Will follow up on records  4. HTN: Stable on current therapy.   5. Hx of DM: Was on metformin, but stopped while in prison. Last Hgb A1c was 5.4 in 1/15. Hgb A1c pending this admission.   6. HLD: on statin. HDL 39 this admission.   Barnet Pall, NP-C Pager [phone] 12/07/2016, 8:10 AM   I have examined the patient and reviewed assessment and plan and discussed with patient.  Agree with above as stated.  Patient with h/o VT and some nonsustained VT, along with chest pain.  Given negative stress test in 2016, I don't think another negative stress test would be  helpful.  He is agreeable to cath. Plan for later today.  Larae Grooms

## 2016-12-07 NOTE — H&P (View-Only) (Signed)
Cardiology Consult    Patient ID: Gared Gazda MRN: [id number], DOB/AGE: [birth year]   Admit date: 12/06/2016 Date of Consult: 12/07/2016  Primary Physician: [name], MD Primary Cardiologist: Dr. Kelly/[name] Requesting Provider: Dr. Eliseo Squires Reason for Consultation: Chest pain  Patient Profile    50 yo male with PMH of NICM, VT, OSA with Cpap, HLD, DM who presented with chest pain.   Past Medical History   Past Medical History:  Diagnosis Date  . Arthritis   . CHF (congestive heart failure) (HCC)    preserved EF, grade 1 diastolic dysfunction in 123456  . Diabetes mellitus without complication ([place])    no medications at this time  . Dysrhythmia   . GERD (gastroesophageal reflux disease)   . Hyperlipidemia   . Hypertension 11/24/11   ECHO-EF52%  . MI (myocardial infarction) 2001  . Pacemaker    with defibrillator  . Shortness of breath    with exertion  . Sleep apnea    cpap setting at 9     Past Surgical History:  Procedure Laterality Date  . CARDIAC CATHETERIZATION    . COLONOSCOPY WITH PROPOFOL N/A 07/13/2014   Procedure: COLONOSCOPY WITH PROPOFOL;  Surgeon: Beryle Beams, MD;  Location: [facility];  Service: Endoscopy;  Laterality: N/A;  aicd  . INSERT / REPLACE / REMOVE PACEMAKER    . MASS EXCISION  10/21/2012   Procedure: EXCISION MASS;  Surgeon: Mcarthur Rossetti, MD;  Location: [facility];  Service: Orthopedics;  Laterality: Left;  Excision Left Ring Finger Mass  . right hand surgery      pins placed in hand due to accident      Allergies  Allergies  Allergen Reactions  . Nitroglycerin Hives    Sub lingual broke him out in hives 2009    History of Present Illness    Mr. Mcgonigle is a 50 yo male with PMH of NICM, VT, OSA with Cpap, HLD, DM. He has underwent cardiac caths in 1998/2005 that showed minor coronary artery stenoses. In 2003 his EF was noted at 15-20% at which time he had a ICD placed. He last saw [name] in 12/14 where his EF was noted  to improve to 50-55%. He was continued on his home medications with plans for routine follow up. He last saw [name] in 2/16 where his device was interrogated and showed normal device function, without any episodes of VT.   Appears he had a stress test at OSH in 6/16 showing no ischemia and LV function at 40%.  Reports he has recently been incarcerated in [place], [place] and was released about a week ago. Reports he did received care while in prison and had his medications managed by a cardiologist there. Has been in his usual state of health, until yesterday afternoon. State he was outside and helped get a car unstuck from the mud. Went inside a took a shower. While in the shower had a sudden onset of weakness, and centralized chest pressure with radiation down into his left arm. Reports feeling like he was going to pass out. Got short of breath, and felt "hot all over". Got out and sat down. States symptoms resolved after about 20 minutes, but he was concerned enough to call 911. States he has also been having a pinching/squeezing sensation in the left side of his chest near device site.   In the ED his labs showed stable electrolytes, Trop negx3, TSH 0.755, lipids showed LDL within target, and HDL 39. CXR  was negative. EKG shows SR with new TWI in lead II, v4-v6, and aVF.   Inpatient Medications    . allopurinol  300 mg Oral Daily  . aspirin  325 mg Oral Daily  . atorvastatin  20 mg Oral q1800  . carvedilol  50 mg Oral BID  . digoxin  0.25 mg Oral Q24H  . enoxaparin (LOVENOX) injection  40 mg Subcutaneous Q24H  . insulin aspart  0-15 Units Subcutaneous TID WC  . irbesartan  150 mg Oral Daily  . pneumococcal 23 valent vaccine  0.5 mL Intramuscular Tomorrow-1000    Family History    Family History  Problem Relation Age of Onset  . Lymphoma Father 55    Social History    Social History   Social History  . Marital status: Married    Spouse name: N/A  . Number of children: N/A  .  Years of education: N/A   Occupational History  . disabled    Social History Main Topics  . Smoking status: Current Every Day Smoker    Packs/day: 0.25    Types: Cigarettes  . Smokeless tobacco: Former Systems developer  . Alcohol use No  . Drug use: No  . Sexual activity: Yes   Other Topics Concern  . Not on file   Social History Narrative  . No narrative on file     Review of Systems    General:  No chills, fever, night sweats or weight changes.  Cardiovascular: See HPI Dermatological: No rash, lesions/masses Respiratory: No cough, dyspnea Urologic: No hematuria, dysuria Abdominal:   No nausea, vomiting, diarrhea, bright red blood per rectum, melena, or hematemesis Neurologic:  No visual changes, wkns, changes in mental status. All other systems reviewed and are otherwise negative except as noted above.  Physical Exam    Blood pressure 137/83, pulse 61, temperature 98.2 F (36.8 C), temperature source Oral, resp. rate 16, height 5\' 8"  (1.727 m), weight 231 lb 6.4 oz (105 kg), SpO2 99 %.  General: Pleasant AA male, NAD Psych: Normal affect. Neuro: Alert and oriented X 3. Moves all extremities spontaneously. HEENT: Normal  Neck: Supple without bruits or JVD. Lungs:  Resp regular and unlabored, CTA. Heart: RRR no s3, s4, or murmurs. Abdomen: Soft, non-tender, non-distended, BS + x 4.  Extremities: No clubbing, cyanosis or edema. DP/PT/Radials 2+ and equal bilaterally.  Labs    Troponin [facility] of Care Test)  Recent Labs  12/06/16 1956  TROPIPOC 0.00    Recent Labs  12/06/16 2234 12/07/16 0408  TROPONINI <0.03 <0.03   Lab Results  Component Value Date   WBC 11.2 (H) 12/06/2016   HGB 13.9 12/06/2016   HCT 40.7 12/06/2016   MCV 83.9 12/06/2016   PLT 206 12/06/2016    Recent Labs Lab 12/06/16 1918  NA 145  K 4.1  CL 110  CO2 27  BUN 9  CREATININE 1.13  CALCIUM 8.6*  GLUCOSE 92   Lab Results  Component Value Date   CHOL 148 12/06/2016   HDL 39 (L)  12/06/2016   LDLCALC 69 12/06/2016   TRIG 201 (H) 12/06/2016   Lab Results  Component Value Date   DDIMER  07/13/2008    <0.22        AT THE INHOUSE ESTABLISHED CUTOFF VALUE OF 0.48 ug/mL FEU, THIS ASSAY HAS BEEN DOCUMENTED IN THE LITERATURE TO HAVE     Radiology Studies    Dg Chest 2 View  Result Date: 12/06/2016 CLINICAL DATA:  Initial evaluation  for acute chest pain. EXAM: CHEST  2 VIEW COMPARISON:  Prior radiograph from 03/29/2013. FINDINGS: Left-sided transvenous pacemaker/ AICD in place, stable. Cardiac and mediastinal silhouettes are stable in size and contour, and remain within normal limits. Lungs are normally inflated. No focal infiltrate, pulmonary edema, or pleural effusion. No pneumothorax. No acute osseus abnormality. IMPRESSION: No active cardiopulmonary disease. Electronically Signed   By: Jeannine Boga M.D.   On: 12/06/2016 20:27    ECG & Cardiac Imaging    EKG: SR with new TWI in lead II, v4-v6, and aVF.   Echo: 12/14  Study Conclusions  - Left ventricle: The cavity size was normal. There was mild concentric hypertrophy. Systolic function was normal. The estimated ejection fraction was in the range of 50% to 55%. Mild inferoseptal hypokinesis. Doppler parameters are consistent with abnormal left ventricular relaxation (grade 1 diastolic dysfunction). The E/e' ratio is <10, suggesting normal LV filling pressure. - Mitral valve: Structurally normal valve. Trivial regurgitation. - Left atrium: LA Volume/ BSA = 18.1 ml/m2 The atrium was normal in size. - Right ventricle: The cavity size was normal. Wall thickness was normal. Pacer wire or catheter noted in right ventricle. Systolic function was normal. - Right atrium: Pacer wire or catheter noted in right atrium. - Inferior vena cava: The vessel was normal in size; the respirophasic diameter changes were in the normal range (= 50%); findings are consistent with normal  central venous pressure. Impressions:  - Compared to the prior echo in 2012, the EF has improved from 35-40% to 50-55%.  Assessment & Plan    50 yo male with PMH of NICM, VT, OSA with Cpap, HLD, DM who presented with chest pain.   1. Chest pressure: Reports having some intermittent episodes of chest pressure that are not associated with rest or exertion. Yesterday had an episode while in the shower with radiation into the left arm, shortness of breath, and felt hot all over. Symptoms lasted for about 20 minutes and resolved.  -- Trop x3 EKG with new TWI noted from previous in 2016. Had a stress test with no ischemia in 2016 while in prison.  -- Last cath was 2005 showing normal LM, mLAD with 30-40%.  -- Discuss with MD regarding further testing, doubt repeat stress test would be beneficial as he just had one last year. Will plan for cardiac cath. The patient understands that risks included but are not limited to stroke (1 in 1000), death (1 in 18), kidney failure [usually temporary] (1 in 500), bleeding (1 in 200), allergic reaction [possibly serious] (1 in 200).   2. NICM: Last Echo in 2014 showed improved EF to 50-55%. No volume overload on exam  3. VT s/pPPM/ICD: Noted to have short runs of VT this admission. Pacemaker was reported to have been interrogated, but records not in chart. Denies any shocks. -- Will follow up on records  4. HTN: Stable on current therapy.   5. Hx of DM: Was on metformin, but stopped while in prison. Last Hgb A1c was 5.4 in 1/15. Hgb A1c pending this admission.   6. HLD: on statin. HDL 39 this admission.   Barnet Pall, NP-C Pager [phone] 12/07/2016, 8:10 AM   I have examined the patient and reviewed assessment and plan and discussed with patient.  Agree with above as stated.  Patient with h/o VT and some nonsustained VT, along with chest pain.  Given negative stress test in 2016, I don't think another negative stress test would be  helpful.  He is agreeable to cath. Plan for later today.  Larae Grooms

## 2016-12-08 ENCOUNTER — Encounter (HOSPITAL_COMMUNITY): Payer: Self-pay | Admitting: Cardiovascular Disease

## 2016-12-08 LAB — HEMOGLOBIN A1C
HEMOGLOBIN A1C: 5.7 % — AB (ref 4.8–5.6)
Mean Plasma Glucose: 117 mg/dL

## 2016-12-30 ENCOUNTER — Other Ambulatory Visit: Payer: Self-pay | Admitting: Nurse Practitioner

## 2016-12-30 ENCOUNTER — Encounter: Payer: Medicare Other | Admitting: Nurse Practitioner

## 2016-12-30 ENCOUNTER — Other Ambulatory Visit: Payer: Self-pay | Admitting: Cardiovascular Disease

## 2016-12-30 ENCOUNTER — Encounter: Payer: Self-pay | Admitting: Nurse Practitioner

## 2016-12-30 MED ORDER — FUROSEMIDE 40 MG PO TABS
40.0000 mg | ORAL_TABLET | Freq: Every day | ORAL | 0 refills | Status: DC
Start: 2016-12-30 — End: 2017-02-12

## 2016-12-30 MED ORDER — CARVEDILOL 25 MG PO TABS: 50.0000 mg | ORAL_TABLET | Freq: Two times a day (BID) | ORAL | 0 refills | Status: DC

## 2016-12-30 MED ORDER — POTASSIUM CHLORIDE ER 10 MEQ PO CPCR: 10.0000 meq | ORAL_CAPSULE | Freq: Every day | ORAL | 0 refills | Status: DC

## 2016-12-30 MED ORDER — VALSARTAN 160 MG PO TABS: 160.0000 mg | ORAL_TABLET | Freq: Every day | ORAL | 0 refills | Status: DC

## 2016-12-30 MED ORDER — DIGOXIN 250 MCG PO TABS: 0.2500 mg | ORAL_TABLET | ORAL | 0 refills | Status: DC

## 2016-12-30 MED ORDER — ATORVASTATIN CALCIUM 20 MG PO TABS: 20.0000 mg | ORAL_TABLET | Freq: Every day | ORAL | 0 refills | Status: DC

## 2016-12-30 NOTE — Telephone Encounter (Signed)
New Medication       *STAT* If patient is at the pharmacy, call can be transferred to refill team.   1. Which medications need to be refilled? (please list name of each medication and dose if known)  allopurinol (ZYLOPRIM) 300 MG tablet Take 300 mg by mouth daily.    aspirin 325 MG tablet Take 325 mg by mouth daily.   atorvastatin (LIPITOR) 20 MG tablet Take 20 mg by mouth daily.   carvedilol (COREG) 25 MG tablet Take 2 tablets (50 mg total) by mouth 2 (two) times daily.   digoxin (LANOXIN) 0.25 MG tablet Take 1 tablet (0.25 mg total) by mouth daily.   furosemide (LASIX) 40 MG tablet TAKE 1 TABLET BY MOUTH DAILY Patient taking differently: TAKE 1 TABLET BY MOUTH TWICE DAILY   naproxen (NAPROSYN) 500 MG tablet Take 500 mg by mouth 2 (two) times daily as needed (arthritis pain).   potassium chloride (MICRO-K) 10 MEQ CR capsule TAKE ONE CAPSULE BY MOUTH DAILY   PRESCRIPTION MEDICATION Inhale into the lungs at bedtime. CPAP   valsartan (DIOVAN) 160 MG tablet Take 160 mg by mouth daily.     2. Which pharmacy/location (including street and city if local pharmacy) is medication to be sent to? [facility] on [address]  3. Do they need a 30 day or 90 day supply? 30 day supply

## 2016-12-30 NOTE — Telephone Encounter (Signed)
Patient cancelled 12/31/15 cath follow up appt Patient has not been seen in office since 2016 15 day supply of medications sent to pharmacy - lipitor, coreg, valsartan, digoxin, lasix, potassium

## 2016-12-31 NOTE — Progress Notes (Signed)
This encounter was created in error - please disregard.

## 2017-02-08 ENCOUNTER — Ambulatory Visit: Payer: Medicare Other | Admitting: Physician Assistant

## 2017-02-08 NOTE — Progress Notes (Deleted)
Cardiology Office Note    Date:  02/08/2017   ID:  [name], DOB 1966-12-19, MRN [id number]  PCP:  [name], MD  Cardiologist:  [name] / [name]  No chief complaint on file.   History of Present Illness:  [name] is a 51 y.o. male with PMH of NICM, VT, OSA on CPAP, HLD and DM. He had cardiac catheterization in 1998 and the 2005-showed minor coronary artery stenosis. In 2003, his CEA was noted to be 15-20% at the time and had Biotronik ICD placed. His defibrillator has repeatedly provide antitachycardia pacing in the past, including 4 times in 2012, once in 2013 and most recently was in February 2015. Fortunately by December 2014, his ejection fraction has improved to 50-55%. He was seen by [name] in the 03/17/2015 where his device was interrogated and showed normal device function without any episode of NSVT. Stress test at outside hospital in June 2016 showed no ischemia with EF of 40%.  He was recently incarcerated in [place] and was released in December. During the incarceration, his medication was managed by a cardiologist there. He was in his usual state of health until he was admitted in the hospital on 04/06/2016 after presented with sudden onset of weakness, feeling of passing out, centralized chest pain radiating down his left arm. 0 troponin was negative 3. EKG shows sinus rhythm with new T-wave inversion in lead 2, V4 through V6 and aVF. He was seen by [name], was noted he had some short runs of VT during that admission, given previous negative stress test in 2016, it was felt a repeat stress test would not be helpful. He eventually underwent cardiac catheterization on 12/07/2016 which showed normal coronaries with right dominant coronary system, preserved LVEF at 55% with focal region of subtle mid inferior hypocontractility, there was evidence of LVH. Given a reassuring exam, he was eventually discharged on the same day. Prior to discharge, his  device was interrogated, it was working well and without evidence of ICD fire.  No EKG    Past Medical History:  Diagnosis Date  . Arthritis   . Chronic diastolic CHF (congestive heart failure) ([place])    a. 2014 Echo: preserved EF, grade 1 diastolic dysfunction;  b. 11/2016 LV Gram: EF 55%.  . Diabetes mellitus without complication ([place])    no medications at this time  . GERD (gastroesophageal reflux disease)   . History of NICM (nonischemic cardiomyopathy) ([place])    a. 1998/2005 Caths - nl cors;  b. 2003 EF 15-20%-->s/p Biotronik AICD; c. 2014 Echo: EF 55%;  d. 2016 Low risk MV;  e. 11/2016 Cath: nl cors, EF 55%.  Marland Kitchen Hyperlipidemia   . Hypertension 11/24/11   ECHO-EF52%  . Sleep apnea    cpap setting at 9   . Ventricular tachycardia ([place])    a. s/p Biotronik 540 Lumax DR-T AICD, ser # [id number].    Past Surgical History:  Procedure Laterality Date  . CARDIAC CATHETERIZATION    . CARDIAC CATHETERIZATION N/A 12/07/2016   Procedure: Left Heart Cath and Coronary Angiography;  Surgeon: Troy Sine, MD;  Location: [facility];  Service: Cardiovascular;  Laterality: N/A;  . COLONOSCOPY WITH PROPOFOL N/A 07/13/2014   Procedure: COLONOSCOPY WITH PROPOFOL;  Surgeon: Beryle Beams, MD;  Location: [facility];  Service: Endoscopy;  Laterality: N/A;  aicd  . INSERT / REPLACE / REMOVE PACEMAKER    . MASS EXCISION  10/21/2012   Procedure: EXCISION  MASS;  Surgeon: Mcarthur Rossetti, MD;  Location: [facility];  Service: Orthopedics;  Laterality: Left;  Excision Left Ring Finger Mass  . right hand surgery      pins placed in hand due to accident     Current Medications: Outpatient Medications Prior to Visit  Medication Sig Dispense Refill  . allopurinol (ZYLOPRIM) 300 MG tablet Take 300 mg by mouth daily.    Marland Kitchen aspirin 325 MG tablet Take 325 mg by mouth daily.    Marland Kitchen atorvastatin (LIPITOR) 20 MG tablet Take 1 tablet (20 mg total) by mouth daily. <PLEASE MAKE APPOINTMENT FOR REFILLS>  15 tablet 0  . carvedilol (COREG) 25 MG tablet Take 2 tablets (50 mg total) by mouth 2 (two) times daily. <PLEASE MAKE APPOINTMENT FOR REFILLS> 60 tablet 0  . DIGOX 250 MCG tablet TAKE 1 TABLET BY MOUTH DAILY. MAKE APPOINTMENT 15 tablet 0  . furosemide (LASIX) 40 MG tablet Take 1 tablet (40 mg total) by mouth daily. <PLEASE MAKE APPOINTMENT FOR REFILLS> 15 tablet 0  . naproxen (NAPROSYN) 500 MG tablet Take 500 mg by mouth 2 (two) times daily as needed (arthritis pain).    . potassium chloride (MICRO-K) 10 MEQ CR capsule Take 1 capsule (10 mEq total) by mouth daily. <PLEASE MAKE APPOINTMENT FOR REFILLS> 15 capsule 0  . PRESCRIPTION MEDICATION Inhale into the lungs at bedtime. CPAP    . valsartan (DIOVAN) 160 MG tablet Take 1 tablet (160 mg total) by mouth daily. <PLEASE MAKE APPOINTMENT FOR REFILLS> 15 tablet 0   No facility-administered medications prior to visit.      Allergies:   Nitroglycerin   Social History   Social History  . Marital status: Married    Spouse name: N/A  . Number of children: N/A  . Years of education: N/A   Occupational History  . disabled    Social History Main Topics  . Smoking status: Current Every Day Smoker    Packs/day: 0.25    Types: Cigarettes  . Smokeless tobacco: Former Systems developer  . Alcohol use No  . Drug use: No  . Sexual activity: Yes   Other Topics Concern  . Not on file   Social History Narrative  . No narrative on file     Family History:  The patient's ***family history includes Lymphoma (age of onset: 75) in his father.   ROS:   Please see the history of present illness.    ROS All other systems reviewed and are negative.   PHYSICAL EXAM:   VS:  There were no vitals taken for this visit.   GEN: Well nourished, well developed, in no acute distress  HEENT: normal  Neck: no JVD, carotid bruits, or masses Cardiac: ***RRR; no murmurs, rubs, or gallops,no edema  Respiratory:  clear to auscultation bilaterally, normal work of  breathing GI: soft, nontender, nondistended, + BS MS: no deformity or atrophy  Skin: warm and dry, no rash Neuro:  Alert and Oriented x 3, Strength and sensation are intact Psych: euthymic mood, full affect  Wt Readings from Last 3 Encounters:  12/06/16 231 lb 6.4 oz (105 kg)  02/26/15 205 lb (93 kg)  02/19/15 202 lb 11.2 oz (91.9 kg)      Studies/Labs Reviewed:   EKG:  EKG is*** ordered today.  The ekg ordered today demonstrates ***  Recent Labs: 12/06/2016: BUN 9; Potassium 4.1; Sodium 145; TSH 0.755 12/07/2016: Creatinine, Ser 1.00; Hemoglobin 13.6; Platelets 170   Lipid Panel    Component  Value Date/Time   CHOL 148 12/06/2016 1918   TRIG 201 (H) 12/06/2016 1918   HDL 39 (L) 12/06/2016 1918   CHOLHDL 3.8 12/06/2016 1918   VLDL 40 12/06/2016 1918   LDLCALC 69 12/06/2016 1918    Additional studies/ records that were reviewed today include:   Cath 12/07/2016  Conclusion     The left ventricular systolic function is normal.  LV end diastolic pressure is normal.   Normal coronary arteries in a right dominant coronary system.  Preserved global LV ejection fraction at approximately 55% with a focal region of subtle mid inferior hypocontractility.  There is evidence for LVH.  RECOMMENDATION: Medical therapy.       ASSESSMENT:    No diagnosis found.   PLAN:  In order of problems listed above:  1. ***    Medication Adjustments/Labs and Tests Ordered: Current medicines are reviewed at length with the patient today.  Concerns regarding medicines are outlined above.  Medication changes, Labs and Tests ordered today are listed in the Patient Instructions below. There are no Patient Instructions on file for this visit.   Hilbert Corrigan, [place]  02/08/2017 6:16 AM    [facility] [address], [place], [place]  [address] Phone: [phone]; Fax: [phone]

## 2017-02-12 ENCOUNTER — Encounter: Payer: Self-pay | Admitting: Physician Assistant

## 2017-02-12 ENCOUNTER — Encounter: Payer: Self-pay | Admitting: *Deleted

## 2017-02-12 ENCOUNTER — Ambulatory Visit (INDEPENDENT_AMBULATORY_CARE_PROVIDER_SITE_OTHER): Payer: Medicare Other | Admitting: Cardiovascular Disease

## 2017-02-12 VITALS — BP 140/82 | HR 69 | Ht 68.0 in | Wt 238.0 lb

## 2017-02-12 DIAGNOSIS — I472 Ventricular tachycardia, unspecified: Secondary | ICD-10-CM

## 2017-02-12 DIAGNOSIS — E669 Obesity, unspecified: Secondary | ICD-10-CM

## 2017-02-12 DIAGNOSIS — Z9989 Dependence on other enabling machines and devices: Secondary | ICD-10-CM

## 2017-02-12 DIAGNOSIS — I10 Essential (primary) hypertension: Secondary | ICD-10-CM | POA: Diagnosis not present

## 2017-02-12 DIAGNOSIS — T82198A Other mechanical complication of other cardiac electronic device, initial encounter: Secondary | ICD-10-CM

## 2017-02-12 DIAGNOSIS — Z95810 Presence of automatic (implantable) cardiac defibrillator: Secondary | ICD-10-CM

## 2017-02-12 DIAGNOSIS — G4733 Obstructive sleep apnea (adult) (pediatric): Secondary | ICD-10-CM

## 2017-02-12 DIAGNOSIS — E66811 Obesity, class 1: Secondary | ICD-10-CM

## 2017-02-12 DIAGNOSIS — I428 Other cardiomyopathies: Secondary | ICD-10-CM

## 2017-02-12 LAB — LIPID PANEL
Cholesterol: 209 mg/dL — ABNORMAL HIGH (ref <200)
HDL: 56 mg/dL (ref >40)
LDL Cholesterol: 135 mg/dL — ABNORMAL HIGH (ref <100)
TRIGLYCERIDES: 88 mg/dL (ref <150)
Total CHOL/HDL Ratio: 3.7 Ratio (ref <5.0)
VLDL: 18 mg/dL (ref <30)

## 2017-02-12 LAB — COMPREHENSIVE METABOLIC PANEL
ALT: 26 U/L (ref 9–46)
AST: 26 U/L (ref 10–35)
Albumin: 4.1 g/dL (ref 3.6–5.1)
Alkaline Phosphatase: 55 U/L (ref 40–115)
BUN: 14 mg/dL (ref 7–25)
CHLORIDE: 108 mmol/L (ref 98–110)
CO2: 26 mmol/L (ref 20–31)
CREATININE: 1.05 mg/dL (ref 0.70–1.33)
Calcium: 9.2 mg/dL (ref 8.6–10.3)
Glucose, Bld: 85 mg/dL (ref 65–99)
Potassium: 4.6 mmol/L (ref 3.5–5.3)
SODIUM: 144 mmol/L (ref 135–146)
TOTAL PROTEIN: 7.1 g/dL (ref 6.1–8.1)
Total Bilirubin: 0.4 mg/dL (ref 0.2–1.2)

## 2017-02-12 LAB — MAGNESIUM: Magnesium: 2.5 mg/dL (ref 1.5–2.5)

## 2017-02-12 MED ORDER — POTASSIUM CHLORIDE ER 10 MEQ PO CPCR: 10.0000 meq | ORAL_CAPSULE | Freq: Every day | ORAL | 3 refills | Status: DC

## 2017-02-12 MED ORDER — CARVEDILOL 25 MG PO TABS: 50.0000 mg | ORAL_TABLET | Freq: Two times a day (BID) | ORAL | 3 refills | Status: AC

## 2017-02-12 MED ORDER — VALSARTAN 160 MG PO TABS: 160.0000 mg | ORAL_TABLET | Freq: Every day | ORAL | 3 refills | Status: DC

## 2017-02-12 MED ORDER — DIGOXIN 250 MCG PO TABS: ORAL_TABLET | ORAL | 3 refills | Status: DC

## 2017-02-12 MED ORDER — FUROSEMIDE 40 MG PO TABS: 40.0000 mg | ORAL_TABLET | Freq: Every day | ORAL | 3 refills | Status: DC

## 2017-02-12 MED ORDER — ALLOPURINOL 300 MG PO TABS: 300.0000 mg | ORAL_TABLET | Freq: Every day | ORAL | 3 refills | Status: DC

## 2017-02-12 MED ORDER — ROSUVASTATIN CALCIUM 20 MG PO TABS: 20.0000 mg | ORAL_TABLET | Freq: Every day | ORAL | 3 refills | Status: AC

## 2017-02-12 NOTE — Patient Instructions (Addendum)
   Medication Instructions:  Your physician recommends that you continue on your current medications as directed. Please refer to the Current Medication list given to you today.  Labwork: Please have lab work today (CMET, [place], Dig)  Return for FASTING lipid panel.    Testing/Procedures: NONE  Follow-Up: Your physician recommends that you schedule a follow-up appointment in: 2 months with [name]   Any Other Special Instructions Will Be Listed Below (If Applicable).     If you need a refill on your cardiac medications before your next appointment, please call your pharmacy.

## 2017-02-12 NOTE — Progress Notes (Signed)
Cardiology Office Note    Date:  02/12/2017   ID:  [name], DOB 05-19-1966, MRN [id number]  PCP:  [name], MD  Cardiologist:  [name] / [name]   No chief complaint on file.   History of Present Illness:  [name] is a 51 y.o. male with PMH of NICM, VT, OSA on CPAP, HLD and DM. He underwent cardiac cath in 1998/2005 showed minor coronary artery stenosis. In 2000. His EF was noted to be 15-20%, he subsequently had ICD placed. Fortunately by December 2014 his EF has improved to 50-55%. He was continued on his home medication with plan for routine follow-up. He last saw [name] in February 2016 where his device was interrogated and showed normal device function without any episode of DVT. He had a stress test at outside hospital in June 2016 that showed no ischemia with EF of 40%.  He has been incarcerated last year [name] and was released in early December. His medications were managed by a cardiologist while in prison. He was in his usual health until 12/06/2016 when he was admitted for weakness and centralized chest pain going down his arm. He underwent diagnostic cardiac catheterization on 12/07/2016 which showed normal coronaries and right dominant coronary system, preserved global LV EF at approximately 55% with focal regional subtle mid inferior hypocontractility, there was evidence of LVH. Medical therapy was recommended. His device was interrogated in the hospital, showing no recent episodes of ventricular arrhythmia detected by the device since one episode that occurred on 04/23/2016 at approximately 179 bpm.  On January 14 at 3:30 AM his defibrillator documents a shock. He remembers receiving a shock at that time, but did not call for help. He did not have syncope or any other untoward symptoms.  Interrogation of his defibrillator today shows some interesting findings.   Estimated generator longevity is likely to approximately August of this year (current  battery at "10%"). The leads have excellent impedance, sensing and pacing thresholds with the exception of rather mediocre R waves at about 3-4 V.  He received an inappropriate defibrillator shock on January 14 at roughly 3:30 AM. The device detected ventricular fibrillation 6 times during that night, but only one event was a long enough to trigger therapy. None of the episodes of VF were genuine. The background rhythm was sinus tachycardia or atrial tachycardia with 1:1 AV conduction. The ventricular electrogram seems identical to his normal QRS. However there was T-wave over sensing intermittently, leading to an estimated ventricular rate of greater than 200 bpm and he received a single 15 J defibrillator shock. After a brief pause, the arrhythmia continued unabated (making me think that it was just sinus tachycardia). Fortunately, the problems with T-wave over sensing seemed to diminish after the shock and no additional discharges occurred. Ventricular fibrillation was inappropriately diagnosed 6 times that night.   Just this morning on February 16 at roughly 6 AM he had true ventricular tachycardia. The rate was around 204 bpm. The ventricular electrogram was clearly different from the baseline EGM. A single 8 beat burst of antitachycardia pacing successfully terminated the arrhythmia. To my knowledge, this is the first time that his ICD has delivered appropriate therapy   Past Medical History:  Diagnosis Date  . Arthritis   . Chronic diastolic CHF (congestive heart failure) ([place])    a. 2014 Echo: preserved EF, grade 1 diastolic dysfunction;  b. 11/2016 LV Gram: EF 55%.  . Diabetes mellitus without complication ([place])  no medications at this time  . GERD (gastroesophageal reflux disease)   . History of NICM (nonischemic cardiomyopathy) ([place])    a. 1998/2005 Caths - nl cors;  b. 2003 EF 15-20%-->s/p Biotronik AICD; c. 2014 Echo: EF 55%;  d. 2016 Low risk MV;  e. 11/2016 Cath: nl cors, EF 55%.  Marland Kitchen  Hyperlipidemia   . Hypertension 11/24/11   ECHO-EF52%  . Sleep apnea    cpap setting at 11   . Ventricular tachycardia ([place])    a. s/p Biotronik 540 Lumax DR-T AICD, ser # [id number].    Past Surgical History:  Procedure Laterality Date  . CARDIAC CATHETERIZATION    . CARDIAC CATHETERIZATION N/A 12/07/2016   Procedure: Left Heart Cath and Coronary Angiography;  Surgeon: Troy Sine, MD;  Location: [facility];  Service: Cardiovascular;  Laterality: N/A;  . COLONOSCOPY WITH PROPOFOL N/A 07/13/2014   Procedure: COLONOSCOPY WITH PROPOFOL;  Surgeon: Beryle Beams, MD;  [facility];  Service: Endoscopy;  Laterality: N/A;  aicd  . INSERT / REPLACE / REMOVE PACEMAKER    . MASS EXCISION  10/21/2012   Procedure: EXCISION MASS;  Surgeon: Mcarthur Rossetti, MD;  [facility];  Service: Orthopedics;  Laterality: Left;  Excision Left Ring Finger Mass  . right hand surgery      pins placed in hand due to accident     Current Medications: Outpatient Medications Prior to Visit  Medication Sig Dispense Refill  . aspirin 325 MG tablet Take 325 mg by mouth daily.    . naproxen (NAPROSYN) 500 MG tablet Take 500 mg by mouth 2 (two) times daily as needed (arthritis pain).    Marland Kitchen PRESCRIPTION MEDICATION Inhale into the lungs at bedtime. CPAP    . allopurinol (ZYLOPRIM) 300 MG tablet Take 300 mg by mouth daily.    . carvedilol (COREG) 25 MG tablet Take 2 tablets (50 mg total) by mouth 2 (two) times daily. <PLEASE MAKE APPOINTMENT FOR REFILLS> 60 tablet 0  . DIGOX 250 MCG tablet TAKE 1 TABLET BY MOUTH DAILY. MAKE APPOINTMENT 15 tablet 0  . furosemide (LASIX) 40 MG tablet Take 1 tablet (40 mg total) by mouth daily. <PLEASE MAKE APPOINTMENT FOR REFILLS> 15 tablet 0  . potassium chloride (MICRO-K) 10 MEQ CR capsule Take 1 capsule (10 mEq total) by mouth daily. <PLEASE MAKE APPOINTMENT FOR REFILLS> 15 capsule 0  . valsartan (DIOVAN) 160 MG tablet Take 1 tablet (160 mg total) by mouth  daily. <PLEASE MAKE APPOINTMENT FOR REFILLS> 15 tablet 0  . atorvastatin (LIPITOR) 20 MG tablet Take 1 tablet (20 mg total) by mouth daily. <PLEASE MAKE APPOINTMENT FOR REFILLS> (Patient not taking: Reported on 02/12/2017) 15 tablet 0   No facility-administered medications prior to visit.      Allergies:   Nitroglycerin   Social History   Social History  . Marital status: Married    Spouse name: N/A  . Number of children: N/A  . Years of education: N/A   Occupational History  . disabled    Social History Main Topics  . Smoking status: Current Every Day Smoker    Packs/day: 0.25    Types: Cigarettes  . Smokeless tobacco: Never Used  . Alcohol use No  . Drug use: No  . Sexual activity: Yes   Other Topics Concern  . None   Social History Narrative  . None     Family History:  The patient's family history includes Lymphoma (age of onset: 22)  in his father.   ROS:   Please see the history of present illness.    ROS All other systems reviewed and are negative.   PHYSICAL EXAM:   VS:  BP 140/82   Pulse 69   Ht 5\' 8"  (1.727 m)   Wt 108 kg (238 lb)   SpO2 94%   BMI 36.19 kg/m    GEN: Well nourished, well developed, in no acute distress  HEENT: normal  Neck: no JVD, carotid bruits, or masses Cardiac: RRR; no murmurs, rubs, or gallops,no edema , healthy left subclavian defibrillator site Respiratory:  clear to auscultation bilaterally, normal work of breathing GI: soft, nontender, nondistended, + BS MS: no deformity or atrophy  Skin: warm and dry, no rash Neuro:  Alert and Oriented x 3, Strength and sensation are intact Psych: euthymic mood, full affect  Wt Readings from Last 3 Encounters:  02/12/17 108 kg (238 lb)  12/06/16 105 kg (231 lb 6.4 oz)  02/26/15 93 kg (205 lb)      Studies/Labs Reviewed:   EKG:  EKG is not ordered today.  The ekg ordered today demonstrates tightness rhythm, borderline IVCD with QRS duration 113 ms (incomplete left bundle branch  block), repolarization abnormalities that might be secondary to LVH (voltage mass by obesity)  Recent Labs: 12/06/2016: BUN 9; Potassium 4.1; Sodium 145; TSH 0.755 12/07/2016: Creatinine, Ser 1.00; Hemoglobin 13.6; Platelets 170   Lipid Panel    Component Value Date/Time   CHOL 148 12/06/2016 1918   TRIG 201 (H) 12/06/2016 1918   HDL 39 (L) 12/06/2016 1918   CHOLHDL 3.8 12/06/2016 1918   VLDL 40 12/06/2016 1918   LDLCALC 69 12/06/2016 1918    Additional studies/ records that were reviewed today include:   Cath 12/07/2016 Conclusion     The left ventricular systolic function is normal.  LV end diastolic pressure is normal.   Normal coronary arteries in a right dominant coronary system.  Preserved global LV ejection fraction at approximately 55% with a focal region of subtle mid inferior hypocontractility.  There is evidence for LVH.  RECOMMENDATION: Medical therapy.       ASSESSMENT:    1. Ventricular tachycardia ([place])   2. Inappropriate discharge of implantable cardioverter-defibrillator (ICD), initial encounter   3. Non-ischemic cardiomyopathy- last EF 35-40% by echo Nov 2012   4. ICD (implantable cardioverter-defibrillator) in place   5. OSA on CPAP   6. Essential hypertension   7. Obesity (BMI 30.0-34.9)      PLAN:  In order of problems listed above:  1. VT: Thaden had appropriate and successful defibrillator antitachycardia pacing therapy for sustained and very rapid VT at over 200 beats a minute. He was unaware of this and was asymptomatic, possibly since he was asleep. Nonsustained VT was documented during his recent hospitalization in December, but (as far as I know) this is the first time that his defibrillator has ever delivered appropriate therapy. This will have important implications regarding our decision for further defibrillator management. One could have argued to that with a recovered LVEF in the absence of previous treatment, he did not need  a generator change. With the current data, unless we find a clear-cut and removable cause for his episode of VT, he should continue to receive ICD therapy for secondary prevention. Check digoxin level and electrolytes. I really don't see a good indication for him to keep taking digoxin but will wait for the lab results. He should not drive for 6 months. Since antitachycardia  pacing was successful at terminating the rapid VT, increased the VF zone to 220 bpm, and increase the number of ATP interventions in the VT zone (170-220) to 4 x burst and 4 x ramp, before shock 2. T wave oversensing: An inappropriate shock was delivered for misdiagnosed ventricular fibrillation related to probable sinus tachycardia with T-wave over sensing on January 14. The appropriate changes were made to the device detection algorithms to blank out T waves. The actual sensitivity was not changed, since his native R waves are only 3-4 V, but rather adjusted the timing of detection decay. 3. CMP: EF has recovered almost completely, suggesting his cardiomyopathy may have been secondary to untreated hypertension 4. ICD: Anticipate need for generator change out before the end of the year. See considerations above. The device was implanted for primary prevention, but he now appears to have a secondary prevention indication. Should probably discuss with EP, as well as with [name]. 5. OSA: Reports that his CPAP device was misplaced or lost while incarcerated. Cannot exclude a correlation between untreated sleep apnea and his arrhythmia. Will need a new piece of equipment. Will talk to [name]. 6. HTN: Borderline control, no changes made, cautioned him about the need for excellent compliance of beta blockers to avoid rebound arrhythmia. 7. Obesity: Weight loss as an important long-term goal 8. HLP: Target LDL<100. He does not have significant CAD or PAD     Medication Adjustments/Labs and Tests Ordered: Current medicines are  reviewed at length with the patient today.  Concerns regarding medicines are outlined above.  Medication changes, Labs and Tests ordered today are listed in the Patient Instructions below. Patient Instructions    Medication Instructions:  Your physician recommends that you continue on your current medications as directed. Please refer to the Current Medication list given to you today.  Labwork: Please have lab work today (CMET, [place], Dig)  Return for FASTING lipid panel.    Testing/Procedures: NONE  Follow-Up: Your physician recommends that you schedule a follow-up appointment in: 2 months with [name]   Any Other Special Instructions Will Be Listed Below (If Applicable).     If you need a refill on your cardiac medications before your next appointment, please call your pharmacy.      Signed, Sanda Klein, MD  02/12/2017 6:47 PM    [facility] [address], [place], [place]  [address] Phone: [phone]; Fax: [phone]

## 2017-02-13 LAB — DIGOXIN LEVEL: DIGOXIN LVL: 0.5 ug/L — AB (ref 0.8–2.0)

## 2017-02-16 ENCOUNTER — Other Ambulatory Visit: Payer: Self-pay | Admitting: Cardiovascular Disease

## 2017-02-16 LAB — LIPID PANEL
CHOL/HDL RATIO: 3.8 ratio (ref <5.0)
CHOLESTEROL: 207 mg/dL — AB (ref <200)
HDL: 54 mg/dL (ref >40)
LDL Cholesterol: 134 mg/dL — ABNORMAL HIGH (ref <100)
Triglycerides: 93 mg/dL (ref <150)
VLDL: 19 mg/dL (ref <30)

## 2017-02-19 ENCOUNTER — Telehealth: Payer: Self-pay

## 2017-02-19 DIAGNOSIS — E785 Hyperlipidemia, unspecified: Secondary | ICD-10-CM

## 2017-02-19 DIAGNOSIS — Z79899 Other long term (current) drug therapy: Secondary | ICD-10-CM

## 2017-02-19 NOTE — Telephone Encounter (Signed)
Notes Recorded by Dionne Bucy Maksym Pfiffner, CMA on 02/19/2017 at 12:57 PM EST Called patient with results. Patient verbalized understanding and agreed with plan. Labs ordered. Paperwork mailed to patient.  Notes Recorded by Sanda Klein, MD on 02/13/2017 at 9:40 AM EST Labs are generally OK (not a cause for his arrhythmia event). Cholesterol is very high off meds. Rosuvastatin has been restarted - recheck CMET and lipids in 3 months.  Notes Recorded by Sanda Klein, MD on 02/17/2017 at 6:00 PM EST Needs to be on statin. I believe this was restarted at the last appt. Please confirm and make sure we scheduled labs in 3 months.

## 2017-03-01 ENCOUNTER — Ambulatory Visit (INDEPENDENT_AMBULATORY_CARE_PROVIDER_SITE_OTHER): Payer: Medicare Other | Admitting: *Deleted

## 2017-03-01 DIAGNOSIS — I428 Other cardiomyopathies: Secondary | ICD-10-CM | POA: Diagnosis not present

## 2017-03-01 NOTE — Progress Notes (Signed)
Remote ICD transmission.   

## 2017-03-02 ENCOUNTER — Encounter: Payer: Self-pay | Admitting: Cardiology

## 2017-03-02 LAB — CUP PACEART REMOTE DEVICE CHECK
Brady Statistic RV Percent Paced: 0 %
HighPow Impedance: 48 Ohm
Implantable Lead Implant Date: 20100803
Implantable Lead Location: 753860
Implantable Lead Model: 350
Implantable Lead Model: 351
Implantable Lead Serial Number: 10367086
Lead Channel Impedance Value: 444 Ohm
Lead Channel Pacing Threshold Pulse Width: 0.4 ms
Lead Channel Sensing Intrinsic Amplitude: 5.2 mV
MDC IDC LEAD IMPLANT DT: 20100803
MDC IDC LEAD LOCATION: 753859
MDC IDC LEAD SERIAL: 28626454
MDC IDC MSMT BATTERY REMAINING PERCENTAGE: 9 %
MDC IDC MSMT LEADCHNL RA IMPEDANCE VALUE: 499 Ohm
MDC IDC MSMT LEADCHNL RA SENSING INTR AMPL: 2 mV
MDC IDC MSMT LEADCHNL RV PACING THRESHOLD AMPLITUDE: 0.8 V
MDC IDC PG IMPLANT DT: 20100803
MDC IDC PG SERIAL: 60474038
MDC IDC SESS DTM: 20180306091749
MDC IDC SET LEADCHNL RA PACING AMPLITUDE: 2 V
MDC IDC SET LEADCHNL RV PACING AMPLITUDE: 2 V
MDC IDC SET LEADCHNL RV PACING PULSEWIDTH: 0.4 ms
MDC IDC STAT BRADY RA PERCENT PACED: 25 %

## 2017-03-04 LAB — CUP PACEART INCLINIC DEVICE CHECK
Implantable Lead Implant Date: 20100803
Implantable Lead Location: 753859
Implantable Lead Model: 351
Implantable Lead Serial Number: 28626454
Lead Channel Setting Pacing Amplitude: 2 V
Lead Channel Setting Pacing Amplitude: 2 V
Lead Channel Setting Pacing Pulse Width: 0.4 ms
MDC IDC LEAD IMPLANT DT: 20100803
MDC IDC LEAD LOCATION: 753860
MDC IDC LEAD SERIAL: 10367086
MDC IDC PG IMPLANT DT: 20100803
MDC IDC PG SERIAL: 60474038
MDC IDC SESS DTM: 20180308160743

## 2017-03-15 ENCOUNTER — Telehealth: Payer: Self-pay | Admitting: Cardiovascular Disease

## 2017-03-15 NOTE — Telephone Encounter (Signed)
Pt calling wanting Dr. Victorino December nurse to give him a call back concerning his gout medication. Please advise

## 2017-03-16 ENCOUNTER — Other Ambulatory Visit: Payer: Self-pay | Admitting: Cardiovascular Disease

## 2017-03-16 MED ORDER — ALLOPURINOL 300 MG PO TABS: 300.0000 mg | ORAL_TABLET | Freq: Every day | ORAL | 3 refills | Status: AC

## 2017-03-16 NOTE — Telephone Encounter (Signed)
Patient requested refills or his gout medication - Allopurinol Okay per MCr. Rx(s) sent to patient's preferred pharmacy electronically.

## 2017-05-19 ENCOUNTER — Institutional Professional Consult (permissible substitution): Payer: Medicare Other | Admitting: Internal Medicine

## 2017-08-10 ENCOUNTER — Ambulatory Visit (INDEPENDENT_AMBULATORY_CARE_PROVIDER_SITE_OTHER): Payer: Medicare Other | Admitting: *Deleted

## 2017-08-10 DIAGNOSIS — I428 Other cardiomyopathies: Secondary | ICD-10-CM

## 2017-08-10 DIAGNOSIS — Z95810 Presence of automatic (implantable) cardiac defibrillator: Secondary | ICD-10-CM

## 2017-08-10 NOTE — Progress Notes (Signed)
Remote defibrillator check.  

## 2017-08-11 LAB — CUP PACEART REMOTE DEVICE CHECK
Battery Remaining Percentage: 2 %
Battery Voltage: 2.9 V
Brady Statistic AP VP Percent: 0 %
Brady Statistic AS VS Percent: 92 %
Date Time Interrogation Session: 20180815074625
HighPow Impedance: 48 Ohm
Implantable Lead Implant Date: 20100803
Implantable Lead Location: 753860
Implantable Lead Model: 351
Implantable Lead Serial Number: 28626454
Implantable Pulse Generator Implant Date: 20100803
Lead Channel Impedance Value: 458 Ohm
Lead Channel Setting Pacing Amplitude: 2 V
Lead Channel Setting Pacing Pulse Width: 0.4 ms
MDC IDC LEAD IMPLANT DT: 20100803
MDC IDC LEAD LOCATION: 753859
MDC IDC LEAD SERIAL: 10367086
MDC IDC MSMT LEADCHNL RV PACING THRESHOLD AMPLITUDE: 1 V
MDC IDC MSMT LEADCHNL RV PACING THRESHOLD PULSEWIDTH: 0.4 ms
MDC IDC SET LEADCHNL RV PACING AMPLITUDE: 2 V
MDC IDC STAT BRADY AP VS PERCENT: 7 %
MDC IDC STAT BRADY AS VP PERCENT: 0 %
MDC IDC STAT BRADY RA PERCENT PACED: 7 %
MDC IDC STAT BRADY RV PERCENT PACED: 0 %
Pulse Gen Serial Number: 60474038

## 2017-08-12 ENCOUNTER — Institutional Professional Consult (permissible substitution): Payer: Medicare Other | Admitting: Internal Medicine

## 2017-08-24 ENCOUNTER — Encounter: Payer: Self-pay | Admitting: Cardiology

## 2017-09-22 ENCOUNTER — Other Ambulatory Visit: Payer: Self-pay | Admitting: Cardiovascular Disease

## 2017-09-22 ENCOUNTER — Telehealth: Payer: Self-pay | Admitting: Cardiovascular Disease

## 2017-09-22 MED ORDER — IRBESARTAN 150 MG PO TABS: 150.0000 mg | ORAL_TABLET | Freq: Every day | ORAL | 1 refills | Status: DC

## 2017-09-22 NOTE — Telephone Encounter (Signed)
Returned call to patient.He stated he is out of Valsartan.Advised Valsartan has been recalled,will change to Irbesartan 150 mg daily.Advised he is past due to see Dr.Croitru.Appointment scheduled with Bernerd Pho PA 10/07/17 at 9:00 am.

## 2017-09-22 NOTE — Telephone Encounter (Signed)
New Message     Pt c/o medication issue:  1. Name of Medication:  Valsartan   2. How are you currently taking this medication (dosage and times per day)? No he stopped it a month ago   3. Are you having a reaction (difficulty breathing--STAT)?  no  4. What is your medication issue? Please call about the recall on the medication

## 2017-09-29 ENCOUNTER — Telehealth: Payer: Self-pay | Admitting: *Deleted

## 2017-09-29 DIAGNOSIS — Z4502 Encounter for adjustment and management of automatic implantable cardiac defibrillator: Secondary | ICD-10-CM

## 2017-09-29 DIAGNOSIS — Z95810 Presence of automatic (implantable) cardiac defibrillator: Secondary | ICD-10-CM

## 2017-09-29 DIAGNOSIS — I428 Other cardiomyopathies: Secondary | ICD-10-CM

## 2017-09-29 NOTE — Telephone Encounter (Signed)
Delighted MCr

## 2017-09-29 NOTE — Telephone Encounter (Signed)
Remote monitoring alert for ICD- device battery at [facility] 09/28/17.  No answer, no VM set up. No DPR on file.   Message sent to [name]- ok to keep appointment on 10/07/17 and schedule for ICD generator replacement at that time.

## 2017-10-04 ENCOUNTER — Institutional Professional Consult (permissible substitution): Payer: Medicare Other | Admitting: Pulmonary Disease

## 2017-10-04 NOTE — Telephone Encounter (Signed)
Patient scheduled for ICD gen change on 10/27/17 at 1330h. Labs ordered to be completed no more than 7 days prior. Awaiting medication instructions.

## 2017-10-06 ENCOUNTER — Encounter: Payer: Self-pay | Admitting: Internal Medicine

## 2017-10-06 ENCOUNTER — Ambulatory Visit (INDEPENDENT_AMBULATORY_CARE_PROVIDER_SITE_OTHER): Payer: Medicare Other | Admitting: Internal Medicine

## 2017-10-06 VITALS — BP 124/78 | HR 74 | Ht 68.0 in | Wt 224.6 lb

## 2017-10-06 DIAGNOSIS — J4520 Mild intermittent asthma, uncomplicated: Secondary | ICD-10-CM

## 2017-10-06 DIAGNOSIS — J3089 Other allergic rhinitis: Secondary | ICD-10-CM | POA: Diagnosis not present

## 2017-10-06 DIAGNOSIS — J449 Chronic obstructive pulmonary disease, unspecified: Secondary | ICD-10-CM | POA: Diagnosis not present

## 2017-10-06 DIAGNOSIS — G4733 Obstructive sleep apnea (adult) (pediatric): Secondary | ICD-10-CM

## 2017-10-06 DIAGNOSIS — Z23 Encounter for immunization: Secondary | ICD-10-CM | POA: Diagnosis not present

## 2017-10-06 DIAGNOSIS — J302 Other seasonal allergic rhinitis: Secondary | ICD-10-CM

## 2017-10-06 MED ORDER — AZELASTINE-FLUTICASONE 137-50 MCG/ACT NA SUSP: 2.0000 | Freq: Every day | NASAL | 0 refills | Status: AC

## 2017-10-06 NOTE — Assessment & Plan Note (Signed)
He quit smoking 10 years ago but notices occasional wheeze. Office spirometry is normal. We won't give an inhaler at this time

## 2017-10-06 NOTE — Patient Instructions (Signed)
Order- schedule unattended home sleep test    Dx OSA  Please call me for results and recommendation about 2 weeks after the sleep test. We may be able to get CPAP restarted for you with [facility], before your next office visit.  Sample Dymista nasal spray     1-2 puffs each nostril, once daily  Order- office spirometry    Dx COPD mixed type  Flu vax- standard

## 2017-10-06 NOTE — Assessment & Plan Note (Signed)
Perennial nasal congestion. Mucosa looks allergic rather than from vasoconstriction. Plan-sample Dymista nasal spray for trial

## 2017-10-06 NOTE — Progress Notes (Signed)
Cardiology Office Note    Date:  10/08/2017   ID:  [name], DOB 03/04/1966, MRN [id number]  PCP:  Nicholas Panda, MD  Cardiologist: [name] [name]   Chief Complaint  Patient presents with  . Follow-up    6 month visit    History of Present Illness:    [name] is a 51 y.o. male with past medical history of NICM with VT (s/p Biotronik  ICD placement in 2003 with normal cors by cath in 1998, 2005, and 11/2016, EF improved to 55% by cath in 11/2016), HTN, HLD, and OSA (on CPAP) who presents to the office today for follow-up.   He was last examined by Almyra Deforest, PA-C in 01/2017 and had been noted to have received an inappropriate defibrillator shock in 12/2016 with no episodes of VT or VT noted. Background rhythm was sinus or atrial tachycardia. The morning of his office visit, he was noted to have an episode of VT and was successfully paced out.   His device has continued to be followed by the device clinic and ERI was 09/28/2017. He has been scheduled for a generator change on 10/27/2017.  In talking with the patient today, he reports doing well from a cardiac perspective since his last office visit. He denies any recent chest pain, palpitations, dyspnea on exertion, orthopnea, PND, or lower extremity edema. Reports his ICD has not fired since his last office visit.   His BP is elevated at 152/110 during his visit, at 148/102 on recheck, but he reports having not yet taken his morning BP medications. He does check his BP regularly at home and says it is usually in the 130's/80's.    Past Medical History:  Diagnosis Date  . Arthritis   . Chronic diastolic CHF (congestive heart failure) ([place])    a. 2014 Echo: preserved EF, grade 1 diastolic dysfunction;  b. 11/2016 LV Gram: EF 55%.  . Diabetes mellitus without complication ([place])    no medications at this time  . GERD (gastroesophageal reflux disease)   . History of NICM (nonischemic cardiomyopathy) ([place])    a. 1998/2005  Caths - nl cors;  b. 2003 EF 15-20%-->s/p Biotronik AICD; c. 2014 Echo: EF 55%;  d. 2016 Low risk MV;  e. 11/2016 Cath: nl cors, EF 55%.  Marland Kitchen Hyperlipidemia   . Hypertension 11/24/11   ECHO-EF52%  . Sleep apnea    cpap setting at 11   . Ventricular tachycardia ([place])    a. s/p Biotronik 540 Lumax DR-T AICD, ser # [id number].    Past Surgical History:  Procedure Laterality Date  . CARDIAC CATHETERIZATION    . CARDIAC CATHETERIZATION N/A 12/07/2016   Procedure: Left Heart Cath and Coronary Angiography;  Surgeon: Troy Sine, MD;  Location: [place] [facility];  Service: Cardiovascular;  Laterality: N/A;  . COLONOSCOPY WITH PROPOFOL N/A 07/13/2014   Procedure: COLONOSCOPY WITH PROPOFOL;  Surgeon: Beryle Beams, MD;  Location: [facility];  Service: Endoscopy;  Laterality: N/A;  aicd  . INSERT / REPLACE / REMOVE PACEMAKER    . MASS EXCISION  10/21/2012   Procedure: EXCISION MASS;  Surgeon: Mcarthur Rossetti, MD;  Location: [facility];  Service: Orthopedics;  Laterality: Left;  Excision Left Ring Finger Mass  . right hand surgery      pins placed in hand due to accident     Current Medications: Outpatient Medications Prior to Visit  Medication Sig Dispense Refill  . allopurinol (ZYLOPRIM) 300 MG tablet  Take 1 tablet (300 mg total) by mouth daily. 90 tablet 3  . aspirin 325 MG tablet Take 325 mg by mouth daily.    . Azelastine-Fluticasone (DYMISTA) 137-50 MCG/ACT SUSP Place 2 sprays into both nostrils at bedtime. 1 Bottle 0  . carvedilol (COREG) 25 MG tablet Take 2 tablets (50 mg total) by mouth 2 (two) times daily. 360 tablet 3  . digoxin (DIGOX) 0.25 MG tablet TAKE 1 TABLET BY MOUTH DAILY. 90 tablet 3  . furosemide (LASIX) 40 MG tablet Take 1 tablet (40 mg total) by mouth daily. 90 tablet 3  . irbesartan (AVAPRO) 150 MG tablet TAKE 1 TABLET(150 MG) BY MOUTH DAILY 90 tablet 1  . naproxen (NAPROSYN) 500 MG tablet Take 500 mg by mouth 2 (two) times daily as needed (arthritis pain).    .  potassium chloride (MICRO-K) 10 MEQ CR capsule Take 1 capsule (10 mEq total) by mouth daily. 90 capsule 3  . PRESCRIPTION MEDICATION Inhale into the lungs at bedtime. CPAP    . rosuvastatin (CRESTOR) 20 MG tablet Take 1 tablet (20 mg total) by mouth daily. 90 tablet 3  . metFORMIN (GLUCOPHAGE) 500 MG tablet Take 500 mg by mouth 2 (two) times daily with a meal.    . topiramate (TOPAMAX) 25 MG capsule Take 25 mg by mouth 3 (three) times daily.     No facility-administered medications prior to visit.      Allergies:   Nitroglycerin   Social History   Social History  . Marital status: Married    Spouse name: N/A  . Number of children: N/A  . Years of education: N/A   Occupational History  . disabled    Social History Main Topics  . Smoking status: Former Smoker    Packs/day: 0.25    Types: Cigarettes    Quit date: 10/07/2007  . Smokeless tobacco: Never Used  . Alcohol use No  . Drug use: No  . Sexual activity: Yes   Other Topics Concern  . None   Social History Narrative  . None     Family History:  The patient's family history includes Lymphoma (age of onset: 68) in his father.   Review of Systems:   Please see the history of present illness.     General:  No chills, fever, night sweats or weight changes.  Cardiovascular:  No chest pain, dyspnea on exertion, edema, orthopnea, palpitations, paroxysmal nocturnal dyspnea. Dermatological: No rash, lesions/masses Respiratory: No cough, dyspnea Urologic: No hematuria, dysuria Abdominal:   No nausea, vomiting, diarrhea, bright red blood per rectum, melena, or hematemesis MSK: Positive for right hip pain.  Neurologic:  No visual changes, wkns, changes in mental status. All other systems reviewed and are otherwise negative except as noted above.   Physical Exam:    VS:  BP (!) 152/110 (BP Location: Left Arm, Patient Position: Sitting, Cuff Size: Large)   Pulse 66   Ht 5\' 8"  (1.727 m)   Wt 224 lb (101.6 kg)   BMI 34.06  kg/m    General: Well developed, well nourished [place] American male appearing in no acute distress. Head: Normocephalic, atraumatic, sclera non-icteric, no xanthomas, nares are without discharge.  Neck: No carotid bruits. JVD not elevated.  Lungs: Respirations regular and unlabored, without wheezes or rales.  Heart: Regular rate and rhythm. No S3 or S4.  No murmur, no rubs, or gallops appreciated. ICD in place.  Abdomen: Soft, non-tender, non-distended with normoactive bowel sounds. No hepatomegaly. No rebound/guarding. No obvious  abdominal masses. Msk:  Strength and tone appear normal for age. No joint deformities or effusions. Extremities: No clubbing or cyanosis. No edema.  Distal pedal pulses are 2+ bilaterally. Neuro: Alert and oriented X 3. Moves all extremities spontaneously. No focal deficits noted. Psych:  Responds to questions appropriately with a normal affect. Skin: No rashes or lesions noted  Wt Readings from Last 3 Encounters:  10/07/17 224 lb (101.6 kg)  10/06/17 224 lb 9.6 oz (101.9 kg)  02/12/17 238 lb (108 kg)     Studies/Labs Reviewed:   EKG:  EKG is ordered today.  The ekg ordered today demonstrates NSR, HR 66, with no acute ST or T-wave changes.   Recent Labs: 12/06/2016: TSH 0.755 12/07/2016: Hemoglobin 13.6; Platelets 170 02/12/2017: ALT 26; BUN 14; Creat 1.05; Magnesium 2.5; Potassium 4.6; Sodium 144   Lipid Panel    Component Value Date/Time   CHOL 207 (H) 02/16/2017 1122   TRIG 93 02/16/2017 1122   HDL 54 02/16/2017 1122   CHOLHDL 3.8 02/16/2017 1122   VLDL 19 02/16/2017 1122   LDLCALC 134 (H) 02/16/2017 1122    Additional studies/ records that were reviewed today include:   Cardiac Catheterization: 11/2016  The left ventricular systolic function is normal.  LV end diastolic pressure is normal.   Normal coronary arteries in a right dominant coronary system.  Preserved global LV ejection fraction at approximately 55% with a focal region of  subtle mid inferior hypocontractility.  There is evidence for LVH.  RECOMMENDATION: Medical therapy.  Device Check: 07/2017 Remote reviewed.  Not pacemaker dependent. Battery status is good. Lead measurements are stable (chronic small R waves). Heart rate histogram is favorable. No clinically significant episodes of high ventricular rate or atrial mode switch noted.  Assessment:    1. Non-ischemic cardiomyopathy   2. Ventricular tachycardia ([place])   3. ICD (implantable cardioverter-defibrillator) in place   4. Essential hypertension   5. Dyslipidemia   6. OSA on CPAP      Plan:   In order of problems listed above:  1. Nonischemic Cardiomyopathy/ VT - patient's EF was previously reduced to 35%, improved to 55% by cath in 11/2016. Cardiac catheterization in 1998, 2005, and 11/2016 showed normal coronary arteries.  - he denies any recent edema, orthopnea, dyspnea, or PND. Does not appear volume overloaded by physical examination today.  - his Biotronik ICD reached ERI on 09/28/2017 and he has been scheduled for a generator change on 10/27/2017. The risks and benefits of the procedure were reviewed in detail with the patient and he agrees to proceed. He will obtain lab work within 7 days of the procedure. Was provided with surgical scrub at today's visit.  - continue Coreg 25mg  BID, Avapro 150mg  daily, Digoxin 0.25mg  daily, and Lasix 40mg  daily.  2. HTN - BP is initially elevated at 152/110, at 148/102 on recheck. Has not yet taken his morning BP medications. - encouraged the patient to continue to follow his BP at home. Continue current medication regimen at this time.   3. HLD - Lipid Panel in 01/2017 showed total cholesterol of 207, HDL 54, and LDL 134. - continue Crestor 20mg  daily. Obtain repeat FLP and LFT's at the time of his pre-procedural labs.   4. OSA - followed by Pulmonology. Has been scheduled for a repeat sleep study as he lost his CPAP machine within the past  year.   Medication Adjustments/Labs and Tests Ordered: Current medicines are reviewed at length with the patient today.  Concerns  regarding medicines are outlined above.  Medication changes, Labs and Tests ordered today are listed in the Patient Instructions below. Patient Instructions  Medication Instructions: Your physician recommends that you continue on your current medications as directed. Please refer to the Current Medication list given to you today.  Labwork: Please have your pre-procedure labs done no more than 7 days prior to your procedure.  Testing/Procedures: Keep appointment scheduled for October 31--ICD procedure with [name].  Follow-Up: Your physician wants you to follow-up in: 6 months with [name]. You will receive a reminder letter in the mail two months in advance. If you don't receive a letter, please call our office to schedule the follow-up appointment.  If you need a refill on your cardiac medications before your next appointment, please call your pharmacy.   Signed, Erma Heritage, PA-C  10/08/2017 12:40 PM    [facility] [address], [place], [place]  [address] Phone: [phone]; Fax: [phone]  [address] [place], [place] [address] Phone: [phone]

## 2017-10-06 NOTE — Progress Notes (Signed)
10/06/17- 51 year old male former smoker for sleep evaluation. Medical problem list includes CAD/ AICD, allergic rhinitis, chronic diastolic CHF, DM 2, GERD, HBP, obesity, NPSG 07/27/10- AHI 68.5/ hr, desaturation to 89%, body weight 211 lbs. [facility] 8/ 24 Sleep Consult; Pt had sleep study 2011-started CPAP-unsure of DME and then lost CPAP machine. Wants to get set up with CPAP.  His wife tells him of loud snoring and witnessed apneas. He admits he doses off easily if quiet, but doesn't find this interferes. He is careful about driving. Not employed. [facility] 7/24   No sleep meds and minimal caffeine. No ENT surgery. Quit smoking 10 years ago. Perennial nasal congestion with some postnasal drip. No history of polyps. Office spirometry 10/06/17- WNL. FVC 3.22/84%, FEV1 2.56/84%, ratio 0.80, FEF 25-75% 2.61/87%.  Prior to Admission medications   Medication Sig Start Date End Date Taking? Authorizing Provider  allopurinol (ZYLOPRIM) 300 MG tablet Take 1 tablet (300 mg total) by mouth daily. 03/16/17  Yes Croitoru, Mihai, MD  aspirin 325 MG tablet Take 325 mg by mouth daily.   Yes [provider]  carvedilol (COREG) 25 MG tablet Take 2 tablets (50 mg total) by mouth 2 (two) times daily. 02/12/17  Yes Croitoru, Mihai, MD  digoxin (DIGOX) 0.25 MG tablet TAKE 1 TABLET BY MOUTH DAILY. 02/12/17  Yes Croitoru, Mihai, MD  furosemide (LASIX) 40 MG tablet Take 1 tablet (40 mg total) by mouth daily. 02/12/17  Yes Croitoru, Mihai, MD  irbesartan (AVAPRO) 150 MG tablet TAKE 1 TABLET(150 MG) BY MOUTH DAILY 09/22/17  Yes Croitoru, Mihai, MD  metFORMIN (GLUCOPHAGE) 500 MG tablet Take 500 mg by mouth 2 (two) times daily with a meal.   Yes [provider]  naproxen (NAPROSYN) 500 MG tablet Take 500 mg by mouth 2 (two) times daily as needed (arthritis pain).   Yes [provider]  potassium chloride (MICRO-K) 10 MEQ CR capsule Take 1 capsule (10 mEq total) by mouth daily. 02/12/17  Yes Croitoru,  Mihai, MD  PRESCRIPTION MEDICATION Inhale into the lungs at bedtime. CPAP   Yes [provider]  rosuvastatin (CRESTOR) 20 MG tablet Take 1 tablet (20 mg total) by mouth daily. 02/12/17  Yes Croitoru, Mihai, MD  topiramate (TOPAMAX) 25 MG capsule Take 25 mg by mouth 3 (three) times daily.   Yes [provider]  Azelastine-Fluticasone (DYMISTA) 137-50 MCG/ACT SUSP Place 2 sprays into both nostrils at bedtime. 10/06/17   Deneise Lever, MD   Past Medical History:  Diagnosis Date  . Arthritis   . Chronic diastolic CHF (congestive heart failure) ([place])    a. 2014 Echo: preserved EF, grade 1 diastolic dysfunction;  b. 11/2016 LV Gram: EF 55%.  . Diabetes mellitus without complication ([place])    no medications at this time  . GERD (gastroesophageal reflux disease)   . History of NICM (nonischemic cardiomyopathy) ([place])    a. 1998/2005 Caths - nl cors;  b. 2003 EF 15-20%-->s/p Biotronik AICD; c. 2014 Echo: EF 55%;  d. 2016 Low risk MV;  e. 11/2016 Cath: nl cors, EF 55%.  Marland Kitchen Hyperlipidemia   . Hypertension 11/24/11   ECHO-EF52%  . Sleep apnea    cpap setting at 11   . Ventricular tachycardia ([place])    a. s/p Biotronik 540 Lumax DR-T AICD, ser # [id number].   Past Surgical History:  Procedure Laterality Date  . CARDIAC CATHETERIZATION    . CARDIAC CATHETERIZATION N/A 12/07/2016   Procedure: Left Heart Cath and Coronary Angiography;  Surgeon:  Troy Sine, MD;  Location: [facility];  Service: Cardiovascular;  Laterality: N/A;  . COLONOSCOPY WITH PROPOFOL N/A 07/13/2014   Procedure: COLONOSCOPY WITH PROPOFOL;  Surgeon: Beryle Beams, MD;  [facility];  Service: Endoscopy;  Laterality: N/A;  aicd  . INSERT / REPLACE / REMOVE PACEMAKER    . MASS EXCISION  10/21/2012   Procedure: EXCISION MASS;  Surgeon: Mcarthur Rossetti, MD;  [facility]: [facility];  Service: Orthopedics;  Laterality: Left;  Excision Left Ring Finger Mass  . right hand surgery      pins placed in  hand due to accident    Family History  Problem Relation Age of Onset  . Lymphoma Father 52   Social History   Social History  . Marital status: Married    Spouse name: N/A  . Number of children: N/A  . Years of education: N/A   Occupational History  . disabled    Social History Main Topics  . Smoking status: Former Smoker    Packs/day: 0.25    Types: Cigarettes    Quit date: 10/07/2007  . Smokeless tobacco: Never Used  . Alcohol use No  . Drug use: No  . Sexual activity: Yes   Other Topics Concern  . Not on file   Social History Narrative  . No narrative on file   ROS-see HPI   + = Positive Constitutional:    weight loss, night sweats, fevers, chills, fatigue, lassitude. HEENT:    headaches, difficulty swallowing, tooth/dental problems, sore throat,       Sneezing,+ itching, ear ache, + nasal congestion, post nasal drip, snoring CV:    +chest pain, orthopnea, PND, swelling in lower extremities, anasarca,                                                       dizziness, palpitations Resp:   shortness of breath with exertion or at rest.                productive cough,   non-productive cough, coughing up of blood.              change in color of mucus.  wheezing.   Skin:    rash or lesions. GI:  No-   heartburn, indigestion, abdominal pain, nausea, vomiting, diarrhea,                 change in bowel habits, loss of appetite GU: dysuria, change in color of urine, no urgency or frequency.   flank pain. MS:   joint pain, stiffness, decreased range of motion, back pain. Neuro-     nothing unusual Psych:  change in mood or affect.  depression or anxiety.   memory loss.  OBJ- Physical Exam General- Alert, Oriented, Affect-appropriate, Distress- none acute Skin- rash-none, lesions- none, excoriation- none Lymphadenopathy- none Head- atraumatic            Eyes- Gross vision intact, PERRLA, conjunctivae and secretions clear            Ears- Hearing, canals-normal             Nose- + pale turbinate edema, no-Septal dev, mucus, polyps, erosion, perforation             Throat- Mallampati III , mucosa clear , drainage- none, tonsils-  atrophic Neck- flexible , trachea midline, no stridor , thyroid nl, carotid no bruit Chest - symmetrical excursion , unlabored           Heart/CV- RRR , no murmur , no gallop  , no rub, nl s1 s2                           - JVD- none , edema- none, stasis changes- none, varices- none           Lung-  wheeze+ trace, cough- none , dullness-none, rub- none           Chest wall-  Abd-  Br/ Gen/ Rectal- Not done, not indicated Extrem- cyanosis- none, clubbing, none, atrophy- none, strength- nl Neuro- grossly intact to observation

## 2017-10-06 NOTE — Assessment & Plan Note (Signed)
He almost certainly has significant persistent obstructive sleep apnea. I suspect his wife say he has more daytime sleepiness then he recognizes. Plan-appropriate discussion. Schedule sleep study anticipating we will probably reorder CPAP.

## 2017-10-07 ENCOUNTER — Encounter: Payer: Self-pay | Admitting: Student

## 2017-10-07 ENCOUNTER — Ambulatory Visit (INDEPENDENT_AMBULATORY_CARE_PROVIDER_SITE_OTHER): Payer: Medicare Other | Admitting: Student

## 2017-10-07 VITALS — BP 152/110 | HR 66 | Ht 68.0 in | Wt 224.0 lb

## 2017-10-07 DIAGNOSIS — G4733 Obstructive sleep apnea (adult) (pediatric): Secondary | ICD-10-CM | POA: Diagnosis not present

## 2017-10-07 DIAGNOSIS — E785 Hyperlipidemia, unspecified: Secondary | ICD-10-CM | POA: Diagnosis not present

## 2017-10-07 DIAGNOSIS — Z95810 Presence of automatic (implantable) cardiac defibrillator: Secondary | ICD-10-CM | POA: Diagnosis not present

## 2017-10-07 DIAGNOSIS — I428 Other cardiomyopathies: Secondary | ICD-10-CM | POA: Diagnosis not present

## 2017-10-07 DIAGNOSIS — Z9989 Dependence on other enabling machines and devices: Secondary | ICD-10-CM

## 2017-10-07 DIAGNOSIS — I472 Ventricular tachycardia, unspecified: Secondary | ICD-10-CM

## 2017-10-07 DIAGNOSIS — I10 Essential (primary) hypertension: Secondary | ICD-10-CM | POA: Diagnosis not present

## 2017-10-07 NOTE — Patient Instructions (Signed)
Medication Instructions: Your physician recommends that you continue on your current medications as directed. Please refer to the Current Medication list given to you today.  Labwork: Please have your pre-procedure labs done no more than 7 days prior to your procedure.  Testing/Procedures: Keep appointment scheduled for October 31--ICD procedure with [name].  Follow-Up: Your physician wants you to follow-up in: 6 months with [name]. You will receive a reminder letter in the mail two months in advance. If you don't receive a letter, please call our office to schedule the follow-up appointment.  If you need a refill on your cardiac medications before your next appointment, please call your pharmacy.

## 2017-10-08 ENCOUNTER — Encounter: Payer: Self-pay | Admitting: Student

## 2017-10-08 NOTE — Progress Notes (Signed)
Thank you, NuB MiCro

## 2017-10-12 ENCOUNTER — Other Ambulatory Visit: Payer: Self-pay | Admitting: Cardiovascular Disease

## 2017-10-12 DIAGNOSIS — Z4502 Encounter for adjustment and management of automatic implantable cardiac defibrillator: Secondary | ICD-10-CM

## 2017-10-19 ENCOUNTER — Telehealth: Payer: Self-pay | Admitting: Cardiovascular Disease

## 2017-10-19 NOTE — Telephone Encounter (Signed)
Returning your call-Call his Mother,he will not be able to talk at work. Pt needs his procedure on a Friday please.

## 2017-10-19 NOTE — Telephone Encounter (Signed)
Attempted to reach patient at mobile #, voice mail not set up yet.

## 2017-10-19 NOTE — Telephone Encounter (Signed)
Routed to [place], [place] who scheduled procedures for [name]

## 2017-10-19 NOTE — Telephone Encounter (Signed)
New Message     Pt states he need change date of surgery , to change battery in his device to a Friday so that he is not missing work.

## 2017-10-22 NOTE — Telephone Encounter (Signed)
Attempted to contact patient again today. No answer. Called mother, [name], after given permission. Requested that she have [name] call back to schedule his procedure. Potential date - 12/03/17. Will not schedule until he returns call.  Called cath lab, cancelled procedure 10/27/17.

## 2017-10-27 ENCOUNTER — Ambulatory Visit (HOSPITAL_COMMUNITY): Admit: 2017-10-27 | Payer: Medicare Other | Admitting: Cardiovascular Disease

## 2017-10-27 ENCOUNTER — Encounter (HOSPITAL_COMMUNITY): Payer: Self-pay

## 2017-10-27 SURGERY — ICD GENERATOR CHANGEOUT

## 2017-11-09 ENCOUNTER — Ambulatory Visit (INDEPENDENT_AMBULATORY_CARE_PROVIDER_SITE_OTHER): Payer: Medicare Other | Admitting: *Deleted

## 2017-11-09 DIAGNOSIS — I428 Other cardiomyopathies: Secondary | ICD-10-CM

## 2017-11-09 NOTE — Progress Notes (Signed)
Remote ICD transmission.   

## 2017-11-11 LAB — CUP PACEART REMOTE DEVICE CHECK
Implantable Lead Implant Date: 20100803
Implantable Lead Implant Date: 20100803
Implantable Lead Location: 753859
Implantable Lead Model: 351
Implantable Lead Serial Number: 10367086
Implantable Lead Serial Number: 28626454
Implantable Pulse Generator Implant Date: 20100803
MDC IDC LEAD LOCATION: 753860
MDC IDC PG SERIAL: 60474038
MDC IDC SESS DTM: 20181115105058

## 2017-11-12 ENCOUNTER — Encounter: Payer: Self-pay | Admitting: Cardiology

## 2017-11-15 NOTE — Telephone Encounter (Signed)
Rescheduled procedure for 12/03/17 at 1:30p. Patient notified.

## 2017-11-16 ENCOUNTER — Telehealth: Payer: Self-pay | Admitting: Cardiovascular Disease

## 2017-11-16 NOTE — Telephone Encounter (Signed)
New Message     Pt states he was called Friday and told to come in the office today to pick up his CPAP equipment? Please call

## 2017-11-16 NOTE — Telephone Encounter (Signed)
Spoke with patient this am and advised needed to call Dr Maren Reamer office regarding sleep information since he is the one ordering that equipment.

## 2017-11-30 LAB — CBC
HEMATOCRIT: 46.2 % (ref 37.5–51.0)
Hemoglobin: 15.8 g/dL (ref 13.0–17.7)
MCH: 30.7 pg (ref 26.6–33.0)
MCHC: 34.2 g/dL (ref 31.5–35.7)
MCV: 90 fL (ref 79–97)
Platelets: 211 10*3/uL (ref 150–379)
RBC: 5.14 x10E6/uL (ref 4.14–5.80)
RDW: 14.3 % (ref 12.3–15.4)
WBC: 8.8 10*3/uL (ref 3.4–10.8)

## 2017-11-30 LAB — BASIC METABOLIC PANEL
BUN / CREAT RATIO: 13 (ref 9–20)
BUN: 11 mg/dL (ref 6–24)
CO2: 23 mmol/L (ref 20–29)
CREATININE: 0.86 mg/dL (ref 0.76–1.27)
Calcium: 9.1 mg/dL (ref 8.7–10.2)
Chloride: 105 mmol/L (ref 96–106)
GFR, EST AFRICAN AMERICAN: 114 mL/min/{1.73_m2} (ref >59)
GFR, EST NON AFRICAN AMERICAN: 99 mL/min/{1.73_m2} (ref >59)
GLUCOSE: 87 mg/dL (ref 65–99)
Potassium: 4.3 mmol/L (ref 3.5–5.2)
SODIUM: 144 mmol/L (ref 134–144)

## 2017-11-30 LAB — PROTIME-INR
INR: 1 (ref 0.8–1.2)
Prothrombin Time: 10.6 s (ref 9.1–12.0)

## 2017-12-03 ENCOUNTER — Ambulatory Visit (HOSPITAL_COMMUNITY)
Admission: RE | Admit: 2017-12-03 | Discharge: 2017-12-03 | Disposition: A | Discharge status: Home / Self Care | Payer: Medicare Other | Source: Ambulatory Visit | Attending: Cardiovascular Disease | Admitting: Cardiovascular Disease

## 2017-12-03 ENCOUNTER — Encounter (HOSPITAL_COMMUNITY)
Admission: RE | Disposition: A | Discharge status: Home / Self Care | Payer: Self-pay | Source: Ambulatory Visit | Attending: Cardiovascular Disease

## 2017-12-03 DIAGNOSIS — I472 Ventricular tachycardia, unspecified: Secondary | ICD-10-CM

## 2017-12-03 DIAGNOSIS — E119 Type 2 diabetes mellitus without complications: Secondary | ICD-10-CM | POA: Diagnosis not present

## 2017-12-03 DIAGNOSIS — E785 Hyperlipidemia, unspecified: Secondary | ICD-10-CM | POA: Diagnosis not present

## 2017-12-03 DIAGNOSIS — I5032 Chronic diastolic (congestive) heart failure: Secondary | ICD-10-CM | POA: Insufficient documentation

## 2017-12-03 DIAGNOSIS — G4730 Sleep apnea, unspecified: Secondary | ICD-10-CM | POA: Insufficient documentation

## 2017-12-03 DIAGNOSIS — Z4502 Encounter for adjustment and management of automatic implantable cardiac defibrillator: Secondary | ICD-10-CM | POA: Diagnosis not present

## 2017-12-03 DIAGNOSIS — M1990 Unspecified osteoarthritis, unspecified site: Secondary | ICD-10-CM | POA: Diagnosis not present

## 2017-12-03 DIAGNOSIS — I428 Other cardiomyopathies: Secondary | ICD-10-CM | POA: Insufficient documentation

## 2017-12-03 DIAGNOSIS — K219 Gastro-esophageal reflux disease without esophagitis: Secondary | ICD-10-CM | POA: Insufficient documentation

## 2017-12-03 DIAGNOSIS — Z87891 Personal history of nicotine dependence: Secondary | ICD-10-CM | POA: Diagnosis not present

## 2017-12-03 DIAGNOSIS — Z7982 Long term (current) use of aspirin: Secondary | ICD-10-CM | POA: Insufficient documentation

## 2017-12-03 DIAGNOSIS — I110 Hypertensive heart disease with heart failure: Secondary | ICD-10-CM | POA: Diagnosis not present

## 2017-12-03 HISTORY — PX: ICD GENERATOR CHANGEOUT: EP1231

## 2017-12-03 LAB — SURGICAL PCR SCREEN
MRSA, PCR: NEGATIVE
Staphylococcus aureus: NEGATIVE

## 2017-12-03 SURGERY — ICD GENERATOR CHANGEOUT

## 2017-12-03 MED ORDER — ONDANSETRON HCL 4 MG/2ML IJ SOLN: 4.0000 mg | Freq: Four times a day (QID) | INTRAMUSCULAR | Status: DC | PRN

## 2017-12-03 MED ORDER — LIDOCAINE HCL (PF) 1 % IJ SOLN
INTRAMUSCULAR | Status: DC | PRN
  Administered 2017-12-03: 30 mL via INTRADERMAL

## 2017-12-03 MED ORDER — HEPARIN (PORCINE) IN NACL 2-0.9 UNIT/ML-% IJ SOLN
INTRAMUSCULAR | Status: AC
  Filled 2017-12-03: qty 1000

## 2017-12-03 MED ORDER — FENTANYL CITRATE (PF) 100 MCG/2ML IJ SOLN
INTRAMUSCULAR | Status: DC | PRN
  Administered 2017-12-03: 50 ug via INTRAVENOUS
  Administered 2017-12-03: 25 ug via INTRAVENOUS

## 2017-12-03 MED ORDER — SODIUM CHLORIDE 0.9 % IR SOLN
Status: AC
  Filled 2017-12-03: qty 2

## 2017-12-03 MED ORDER — CHLORHEXIDINE GLUCONATE 4 % EX LIQD: 60.0000 mL | Freq: Once | CUTANEOUS | Status: DC

## 2017-12-03 MED ORDER — MIDAZOLAM HCL 5 MG/5ML IJ SOLN
INTRAMUSCULAR | Status: AC
  Filled 2017-12-03: qty 5

## 2017-12-03 MED ORDER — MUPIROCIN 2 % EX OINT
TOPICAL_OINTMENT | CUTANEOUS | Status: AC
  Filled 2017-12-03: qty 22

## 2017-12-03 MED ORDER — IRBESARTAN 300 MG PO TABS: 300.0000 mg | ORAL_TABLET | Freq: Every day | ORAL | 3 refills | Status: AC

## 2017-12-03 MED ORDER — MIDAZOLAM HCL 5 MG/5ML IJ SOLN
INTRAMUSCULAR | Status: DC | PRN
  Administered 2017-12-03 (×2): 1 mg via INTRAVENOUS

## 2017-12-03 MED ORDER — SODIUM CHLORIDE 0.9 % IR SOLN
80.0000 mg | Status: AC
  Administered 2017-12-03: 80 mg

## 2017-12-03 MED ORDER — ACETAMINOPHEN 325 MG PO TABS
325.0000 mg | ORAL_TABLET | ORAL | Status: DC | PRN
  Administered 2017-12-03: 650 mg via ORAL
  Filled 2017-12-03 (×2): qty 2

## 2017-12-03 MED ORDER — MUPIROCIN 2 % EX OINT
1.0000 "application " | TOPICAL_OINTMENT | Freq: Once | CUTANEOUS | Status: AC
  Administered 2017-12-03: 1 via TOPICAL

## 2017-12-03 MED ORDER — FENTANYL CITRATE (PF) 100 MCG/2ML IJ SOLN
INTRAMUSCULAR | Status: AC
  Filled 2017-12-03: qty 2

## 2017-12-03 MED ORDER — SODIUM CHLORIDE 0.9 % IV SOLN
INTRAVENOUS | Status: DC
  Administered 2017-12-03: 13:00:00 via INTRAVENOUS

## 2017-12-03 MED ORDER — HEPARIN (PORCINE) IN NACL 2-0.9 UNIT/ML-% IJ SOLN
INTRAMUSCULAR | Status: AC | PRN
  Administered 2017-12-03: 500 mL

## 2017-12-03 MED ORDER — CEFAZOLIN SODIUM-DEXTROSE 2-4 GM/100ML-% IV SOLN
2.0000 g | INTRAVENOUS | Status: AC
  Administered 2017-12-03: 2 g via INTRAVENOUS

## 2017-12-03 MED ORDER — ACETAMINOPHEN 325 MG PO TABS
ORAL_TABLET | ORAL | Status: AC
  Administered 2017-12-03: 650 mg via ORAL
  Filled 2017-12-03: qty 2

## 2017-12-03 MED ORDER — CEFAZOLIN SODIUM-DEXTROSE 2-4 GM/100ML-% IV SOLN
INTRAVENOUS | Status: AC
  Filled 2017-12-03: qty 100

## 2017-12-03 SURGICAL SUPPLY — 4 items
CABLE SURGICAL S-101-97-12 (CABLE) ×2 IMPLANT
DEFIBRILLATOR IPERIA 7DR-T (ICD Generator) ×2 IMPLANT
PAD DEFIB LIFELINK (PAD) ×3 IMPLANT
TRAY PACEMAKER INSERTION (PACKS) ×3 IMPLANT

## 2017-12-03 NOTE — Discharge Instructions (Signed)
Supplemental Discharge Instructions for  Pacemaker/Defibrillator Patients  Activity NO DRIVING today. Resume normal activity tomorrow. DO wear your seatbelt, even if it crosses over the pacemaker site.  WOUND CARE - Keep the wound area clean and dry.  Remove the dressing the day after you return home (usually 48 hours after the procedure). - DO NOT SUBMERGE UNDER WATER UNTIL FULLY HEALED (no tub baths, hot tubs, swimming pools, etc.).  - You  may shower or take a sponge bath after the dressing is removed. DO NOT SOAK the area and do not allow the shower to directly spray on the site. - If you have staples, these will be removed in the office in 7-14 days. - If you have tape/steri-strips on your wound, these will fall off; do not pull them off prematurely.   - No bandage is needed on the site.  DO  NOT apply any creams, oils, or ointments to the wound area. - If you notice any drainage or discharge from the wound, any swelling, excessive redness or bruising at the site, or if you develop a fever > 101? F after you are discharged home, call the office at once.  Special Instructions - You are still able to use cellular telephones.  Avoid carrying your cellular phone near your device. - When traveling through airports, show security personnel your identification card to avoid being screened in the metal detectors.  - Avoid arc welding equipment, MRI testing (magnetic resonance imaging), TENS units (transcutaneous nerve stimulators).  Call the office for questions about other devices. - Avoid electrical appliances that are in poor condition or are not properly grounded. - Microwave ovens are safe to be near or to operate.  Additional information for defibrillator patients should your device go off: - If your device goes off ONCE and you feel fine afterward, notify the clinic at [phone]. - If your device goes off ONCE and you do not feel well afterward, call 911. - If your device goes off  TWICE or more in one day, call 911.  DO NOT DRIVE YOURSELF OR A FAMILY MEMBER WITH A DEFIBRILLATOR TO THE HOSPITAL--CALL 911.

## 2017-12-03 NOTE — Op Note (Signed)
Procedure report  Procedure performed:  1. Dual chamber ICD generator changeout  2. Light sedation  Reason for procedure:  1. Device generator at elective replacement interval  2. Ventricular tachycardia 3. Nonischemic cardiomyopathy Procedure performed by:  Sanda Klein, MD  Complications:  None  Estimated blood loss:  <5 mL  Medications administered during procedure:  Ancef 2 g intravenously, lidocaine 1% 30 mL locally, fentanyl 75 mcg intravenously, Versed 2 mg intravenously  During this procedure the patient is administered a total of Versed 2 mg and Fentanyl 75 mcg to achieve and maintain moderate conscious sedation.  The patient's heart rate, blood pressure, and oxygen saturation are monitored continuously during the procedure. The period of conscious sedation is 47 minutes, of which I was present face-to-face 100% of this time.  Device details:   [employer] Iperia 7 DR-T model number [id number], serial number [id number] Right atrial lead (chronic) Biotronik, model number Setrox S-53, serial [id number] (implanted 07/30/2009) Right ventricular lead (chronic)  Biotronik, model number Linox SD-65, serial number [id number] (implanted 07/30/2009)  Explanted generator Biotronik Lumax  (implanted 07/30/2009)  Procedure details:  After the risks and benefits of the procedure were discussed the patient provided informed consent. She was brought to the cardiac catheter lab in the fasting state. The patient was prepped and draped in usual sterile fashion. Local anesthesia with 1% lidocaine was administered to to the left infraclavicular area. A 5-6cm horizontal incision was made parallel with and 2-3 cm caudal to the left clavicle, in the area of an old scar.  Using minimal electrocautery and mostly sharp and blunt dissection the prepectoral pocket was opened carefully to avoid injury to the loops of chronic leads. Extensive dissection was necessary, as it seems the device was  placed in a mesh pouch (possibly Tyrx first generation?). The device was explanted. The pocket was carefully inspected for hemostasis and flushed with copious amounts of antibiotic solution.  The leads were disconnected from the old generator. The new generator was connected to the chronic leads, with appropriate pacing noted and testing of the lead parameters via telemetry showed excellent values.   The entire system was then carefully inserted in the pocket with care been taking that the leads and device assumed a comfortable position without pressure on the incision. Great care was taken that the leads be located deep to the generator. The pocket was then closed in layers using 2 layers of 2-0 Vicryl and cutaneous staples after which a sterile dressing was applied.   At the end of the procedure the following lead parameters were encountered:   Right atrial lead sensed P waves 3.3 mV, impedance 610 ohms, threshold 0.6 at 0.4 ms pulse width.  Right ventricular lead sensed R waves  3.4 mV (chronically low), impedance 494 ohms, threshold 0.8 at 0.4 ms pulse width.  Sanda Klein, MD, [facility] [phone] office [phone] pager

## 2017-12-03 NOTE — H&P (Signed)
Cardiology Admission History and Physical:   Patient ID: [name]; MRN: [id number]; DOB: 1966-01-17   Admission date: 12/03/2017  Primary Care Provider: [name], MD Primary Cardiologist:  Myrl Lazarus   Chief Complaint:  ICD generator changeout  Patient Profile:   [name] is a 51 y.o. male with a history of NICMP whose ICD has reached ERI.   History of Present Illness:   [name] He has received both inappropriate shocks (sinus tachycardia with T wave oversensing) and appropriate therapy (ATP for sustained monomorphic VT) in the past from his device. He has compensated CHF and EF has recovered on medical therapy. Sustained VT has occurred even after EF recovery. He is compliant with high dose carvedilol and with ARB and only needs a low dose of daily loop diuretic.  The patient specifically denies any chest pain at rest or with exertion, dyspnea at rest or with exertion, orthopnea, paroxysmal nocturnal dyspnea, syncope, palpitations, focal neurological deficits, intermittent claudication, lower extremity edema, unexplained weight gain, cough, hemoptysis or wheezing.  The patient also denies abdominal pain, nausea, vomiting, dysphagia, diarrhea, constipation, polyuria, polydipsia, dysuria, hematuria, frequency, urgency, abnormal bleeding or bruising, fever, chills, unexpected weight changes, mood swings, change in skin or hair texture, change in voice quality, auditory or visual problems, allergic reactions or rashes, new musculoskeletal complaints other than usual "aches and pains".  CATH 12/07/2016  Conclusion     The left ventricular systolic function is normal.  LV end diastolic pressure is normal.   Normal coronary arteries in a right dominant coronary system.  Preserved global LV ejection fraction at approximately 55% with a focal region of subtle mid inferior hypocontractility.  There is evidence for LVH.  RECOMMENDATION: Medical therapy.    ECHO 2014  - Left  ventricle: The cavity size was normal. There was mild concentric hypertrophy. Systolic function was normal. The estimated ejection fraction was in the range of 50% to 55%. Mild inferoseptal hypokinesis. Doppler parameters are consistent with abnormal left ventricular relaxation (grade 1 diastolic dysfunction). The E/e' ratio is <10, suggesting normal LV filling pressure. - Mitral valve: Structurally normal valve. Trivial regurgitation. - Left atrium: LA Volume/ BSA = 18.1 ml/m2 The atrium was normal in size. - Right ventricle: The cavity size was normal. Wall thickness was normal. Pacer wire or catheter noted in right ventricle. Systolic function was normal. - Right atrium: Pacer wire or catheter noted in right atrium. - Inferior vena cava: The vessel was normal in size; the respirophasic diameter changes were in the normal range (= 50%); findings are consistent with normal central venous pressure. Impressions:  - Compared to the prior echo in 2012, the EF has improved from 35-40% to 50-55%.   Past Medical History:  Diagnosis Date  . Arthritis   . Chronic diastolic CHF (congestive heart failure) ([place])    a. 2014 Echo: preserved EF, grade 1 diastolic dysfunction;  b. 11/2016 LV Gram: EF 55%.  . Diabetes mellitus without complication ([place])    no medications at this time  . GERD (gastroesophageal reflux disease)   . History of NICM (nonischemic cardiomyopathy) ([place])    a. 1998/2005 Caths - nl cors;  b. 2003 EF 15-20%-->s/p Biotronik AICD; c. 2014 Echo: EF 55%;  d. 2016 Low risk MV;  e. 11/2016 Cath: nl cors, EF 55%.  Marland Kitchen Hyperlipidemia   . Hypertension 11/24/11   ECHO-EF52%  . Sleep apnea    cpap setting at 11   . Ventricular tachycardia ([place])    a. s/p Biotronik  540 Lumax DR-T AICD, ser # [id number].    Past Surgical History:  Procedure Laterality Date  . CARDIAC CATHETERIZATION    . CARDIAC CATHETERIZATION N/A 12/07/2016   Procedure: Left  Heart Cath and Coronary Angiography;  Surgeon: Troy Sine, MD;  Location: [facility];  Service: Cardiovascular;  Laterality: N/A;  . COLONOSCOPY WITH PROPOFOL N/A 07/13/2014   Procedure: COLONOSCOPY WITH PROPOFOL;  Surgeon: Beryle Beams, MD;  Location: [facility];  Service: Endoscopy;  Laterality: N/A;  aicd  . INSERT / REPLACE / REMOVE PACEMAKER    . MASS EXCISION  10/21/2012   Procedure: EXCISION MASS;  Surgeon: Mcarthur Rossetti, MD;  Location: [facility];  Service: Orthopedics;  Laterality: Left;  Excision Left Ring Finger Mass  . right hand surgery      pins placed in hand due to accident      Medications Prior to Admission: Prior to Admission medications   Medication Sig Start Date End Date Taking? Authorizing Provider  allopurinol (ZYLOPRIM) 300 MG tablet Take 1 tablet (300 mg total) by mouth daily. 03/16/17  Yes Jolynne Spurgin, MD  aspirin 325 MG tablet Take 325 mg by mouth daily.   Yes [provider]  carvedilol (COREG) 25 MG tablet Take 2 tablets (50 mg total) by mouth 2 (two) times daily. 02/12/17  Yes Arend Bahl, MD  digoxin (DIGOX) 0.25 MG tablet TAKE 1 TABLET BY MOUTH DAILY. 02/12/17  Yes Thurl Boen, MD  furosemide (LASIX) 40 MG tablet Take 1 tablet (40 mg total) by mouth daily. 02/12/17  Yes Durga Saldarriaga, MD  irbesartan (AVAPRO) 150 MG tablet TAKE 1 TABLET(150 MG) BY MOUTH DAILY 09/22/17  Yes Lashayla Armes, MD  naproxen (NAPROSYN) 500 MG tablet Take 500 mg by mouth 2 (two) times daily as needed (arthritis pain).   Yes [provider]  potassium chloride (MICRO-K) 10 MEQ CR capsule Take 1 capsule (10 mEq total) by mouth daily. 02/12/17  Yes Nataleah Scioneaux, MD  rosuvastatin (CRESTOR) 20 MG tablet Take 1 tablet (20 mg total) by mouth daily. 02/12/17  Yes Ricca Melgarejo, MD  Azelastine-Fluticasone (DYMISTA) 137-50 MCG/ACT SUSP Place 2 sprays into both nostrils at bedtime. Patient not taking: Reported on 11/29/2017 10/06/17   Deneise Lever, MD  PRESCRIPTION MEDICATION Inhale into the lungs at bedtime. CPAP    [provider]     Allergies:    Allergies  Allergen Reactions  . Nitroglycerin Hives    Sub lingual broke him out in hives 2009    Social History:   Social History   Socioeconomic History  . Marital status: Married    Spouse name: Not on file  . Number of children: Not on file  . Years of education: Not on file  . Highest education level: Not on file  Social Needs  . Financial resource strain: Not on file  . Food insecurity - worry: Not on file  . Food insecurity - inability: Not on file  . Transportation needs - medical: Not on file  . Transportation needs - non-medical: Not on file  Occupational History  . Occupation: disabled  Tobacco Use  . Smoking status: Former Smoker    Packs/day: 0.25    Types: Cigarettes    Last attempt to quit: 10/07/2007    Years since quitting: 10.1  . Smokeless tobacco: Never Used  Substance and Sexual Activity  . Alcohol use: No  . Drug use: No  . Sexual activity: Yes  Other Topics Concern  .  Not on file  Social History Narrative  . Not on file    Family History:   The patient's family history includes Lymphoma (age of onset: 7) in his father.    ROS:  Please see the history of present illness.  All other ROS reviewed and negative.     Physical Exam/Data:   Vitals:   12/03/17 1155  BP: (!) 149/107  Pulse: 66  Resp: 18  Temp: 98.4 F (36.9 C)  TempSrc: Oral  SpO2: 97%  Weight: 225 lb (102.1 kg)  Height: 5\' 8"  (1.727 m)   No intake or output data in the 24 hours ending 12/03/17 1323 Filed Weights   12/03/17 1155  Weight: 225 lb (102.1 kg)   Body mass index is 34.21 kg/m.  General:  Well nourished, well developed, in no acute distress. Obese HEENT: normal Lymph: no adenopathy Neck: no JVD Endocrine:  No thryomegaly Vascular: No carotid bruits; FA pulses 2+ bilaterally without bruits  Cardiac:  normal S1, S2; RRR; no  murmur . Healthy L subclavian ICD site Lungs:  clear to auscultation bilaterally, no wheezing, rhonchi or rales  Abd: soft, nontender, no hepatomegaly  Ext: no edema Musculoskeletal:  No deformities, BUE and BLE strength normal and equal Skin: warm and dry  Neuro:  CNs 2-12 intact, no focal abnormalities noted Psych:  Normal affect    EKG:  The ECG that was done  was personally reviewed and demonstrates NSR, LVH, anterolateral T wave inversion  Laboratory Data:  Chemistry Recent Labs  Lab 11/30/17 1141  NA 144  K 4.3  CL 105  CO2 23  GLUCOSE 87  BUN 11  CREATININE 0.86  CALCIUM 9.1  GFRNONAA 99  GFRAA 114    No results for input(s): PROT, ALBUMIN, AST, ALT, ALKPHOS, BILITOT in the last 168 hours. Hematology Recent Labs  Lab 11/30/17 1141  WBC 8.8  RBC 5.14  HGB 15.8  HCT 46.2  MCV 90  MCH 30.7  MCHC 34.2  RDW 14.3  PLT 211   Cardiac EnzymesNo results for input(s): TROPONINI in the last 168 hours. No results for input(s): TROPIPOC in the last 168 hours.  BNPNo results for input(s): BNP, PROBNP in the last 168 hours.  DDimer No results for input(s): DDIMER in the last 168 hours.  Radiology/Studies:  No results found.  Assessment and Plan:   1. NICMP/CHF: recovered EF on medical therapy. NYHA class 1-2, euvolemic 2. VT: despite EF recovery, I believe it is appropriate to go ahead with ICD changeout as secondary prevention therapy. 3. ICD@ERI : This procedure has been fully reviewed with the patient and written informed consent has been obtained. 4. HTN: BP still quite high. Will increase irbesartan to 300 mg daily at DC. Needs to limit use of NSAIDs.     For questions or updates, please contact [place] Please consult [email or web address] for contact info under Cardiology/STEMI.    Signed, Sanda Klein, MD  12/03/2017 1:23 PM

## 2017-12-05 ENCOUNTER — Encounter (HOSPITAL_COMMUNITY): Payer: Self-pay | Admitting: Cardiovascular Disease

## 2017-12-07 ENCOUNTER — Encounter (HOSPITAL_COMMUNITY): Payer: Self-pay | Admitting: Cardiovascular Disease

## 2017-12-08 MED FILL — Gentamicin Sulfate Inj 40 MG/ML: INTRAMUSCULAR | Qty: 80 | Status: AC

## 2017-12-09 ENCOUNTER — Encounter (HOSPITAL_COMMUNITY): Payer: Self-pay | Admitting: Cardiovascular Disease

## 2017-12-13 ENCOUNTER — Encounter (HOSPITAL_COMMUNITY): Payer: Self-pay | Admitting: Cardiovascular Disease

## 2017-12-17 ENCOUNTER — Ambulatory Visit: Payer: Medicare Other

## 2017-12-29 ENCOUNTER — Ambulatory Visit: Payer: Medicare Other

## 2017-12-30 ENCOUNTER — Ambulatory Visit (INDEPENDENT_AMBULATORY_CARE_PROVIDER_SITE_OTHER): Payer: Medicare Other | Admitting: *Deleted

## 2017-12-30 DIAGNOSIS — I428 Other cardiomyopathies: Secondary | ICD-10-CM | POA: Diagnosis not present

## 2017-12-30 NOTE — Progress Notes (Signed)
Wound check appointment. Staples removed. Wound without redness or edema. Incision edges approximated, wound well healed. Normal device function. Thresholds, sensing, and impedances consistent with implant measurements. Device programmed at chronic values s/p gen change. Histogram distribution appropriate for patient and level of activity. No mode switches or ventricular arrhythmias noted. Patient educated about wound care, arm mobility, lifting restrictions, shock plan. Will message scheduler for 3mo f/u with [facility]

## 2018-01-10 ENCOUNTER — Ambulatory Visit: Payer: Medicare Other | Admitting: Internal Medicine

## 2018-01-13 ENCOUNTER — Encounter: Payer: Self-pay | Admitting: Cardiovascular Disease

## 2018-02-02 ENCOUNTER — Ambulatory Visit: Payer: Medicare Other | Admitting: Internal Medicine

## 2018-02-08 ENCOUNTER — Ambulatory Visit (INDEPENDENT_AMBULATORY_CARE_PROVIDER_SITE_OTHER): Payer: Medicare Other | Admitting: *Deleted

## 2018-02-08 DIAGNOSIS — I428 Other cardiomyopathies: Secondary | ICD-10-CM

## 2018-02-08 NOTE — Progress Notes (Signed)
Remote ICD transmission.   

## 2018-02-09 ENCOUNTER — Encounter: Payer: Self-pay | Admitting: Cardiology

## 2018-02-19 LAB — CUP PACEART REMOTE DEVICE CHECK
Date Time Interrogation Session: 20190223114638
Implantable Lead Implant Date: 20100803
Implantable Lead Location: 753860
Implantable Lead Serial Number: 28626454
MDC IDC LEAD IMPLANT DT: 20100803
MDC IDC LEAD LOCATION: 753859
MDC IDC LEAD SERIAL: 10367086
MDC IDC PG IMPLANT DT: 20181207
Pulse Gen Model: 392409
Pulse Gen Serial Number: 61019047

## 2018-04-15 ENCOUNTER — Encounter: Payer: Medicare Other | Admitting: Cardiovascular Disease

## 2018-04-15 NOTE — Progress Notes (Deleted)
No show

## 2018-04-19 ENCOUNTER — Encounter: Payer: Self-pay | Admitting: *Deleted

## 2018-04-29 ENCOUNTER — Emergency Department (HOSPITAL_BASED_OUTPATIENT_CLINIC_OR_DEPARTMENT_OTHER): Payer: Medicare Other

## 2018-04-29 ENCOUNTER — Emergency Department (HOSPITAL_BASED_OUTPATIENT_CLINIC_OR_DEPARTMENT_OTHER)
Admission: EM | Admit: 2018-04-29 | Discharge: 2018-04-29 | Disposition: A | Discharge status: Home / Self Care | Payer: Medicare Other | Attending: Emergency Medicine | Admitting: Emergency Medicine

## 2018-04-29 ENCOUNTER — Encounter (HOSPITAL_BASED_OUTPATIENT_CLINIC_OR_DEPARTMENT_OTHER): Payer: Self-pay | Admitting: *Deleted

## 2018-04-29 ENCOUNTER — Other Ambulatory Visit: Payer: Self-pay

## 2018-04-29 DIAGNOSIS — X58XXXA Exposure to other specified factors, initial encounter: Secondary | ICD-10-CM | POA: Diagnosis not present

## 2018-04-29 DIAGNOSIS — I5032 Chronic diastolic (congestive) heart failure: Secondary | ICD-10-CM | POA: Diagnosis not present

## 2018-04-29 DIAGNOSIS — Y929 Unspecified place or not applicable: Secondary | ICD-10-CM | POA: Insufficient documentation

## 2018-04-29 DIAGNOSIS — S76211A Strain of adductor muscle, fascia and tendon of right thigh, initial encounter: Secondary | ICD-10-CM

## 2018-04-29 DIAGNOSIS — I110 Hypertensive heart disease with heart failure: Secondary | ICD-10-CM | POA: Insufficient documentation

## 2018-04-29 DIAGNOSIS — E119 Type 2 diabetes mellitus without complications: Secondary | ICD-10-CM | POA: Insufficient documentation

## 2018-04-29 DIAGNOSIS — Z87891 Personal history of nicotine dependence: Secondary | ICD-10-CM | POA: Insufficient documentation

## 2018-04-29 DIAGNOSIS — Z79899 Other long term (current) drug therapy: Secondary | ICD-10-CM | POA: Diagnosis not present

## 2018-04-29 DIAGNOSIS — S76219A Strain of adductor muscle, fascia and tendon of unspecified thigh, initial encounter: Secondary | ICD-10-CM | POA: Insufficient documentation

## 2018-04-29 DIAGNOSIS — S79921A Unspecified injury of right thigh, initial encounter: Secondary | ICD-10-CM | POA: Diagnosis present

## 2018-04-29 DIAGNOSIS — I2510 Atherosclerotic heart disease of native coronary artery without angina pectoris: Secondary | ICD-10-CM | POA: Diagnosis not present

## 2018-04-29 DIAGNOSIS — Y999 Unspecified external cause status: Secondary | ICD-10-CM | POA: Diagnosis not present

## 2018-04-29 DIAGNOSIS — Y939 Activity, unspecified: Secondary | ICD-10-CM | POA: Insufficient documentation

## 2018-04-29 LAB — URINALYSIS, ROUTINE W REFLEX MICROSCOPIC
Bilirubin Urine: NEGATIVE
GLUCOSE, UA: NEGATIVE mg/dL
Hgb urine dipstick: NEGATIVE
Ketones, ur: NEGATIVE mg/dL
LEUKOCYTES UA: NEGATIVE
NITRITE: NEGATIVE
PH: 5.5 (ref 5.0–8.0)
Protein, ur: NEGATIVE mg/dL
Specific Gravity, Urine: 1.025 (ref 1.005–1.030)

## 2018-04-29 MED ORDER — CYCLOBENZAPRINE HCL 10 MG PO TABS: 10.0000 mg | ORAL_TABLET | Freq: Two times a day (BID) | ORAL | 0 refills | Status: AC | PRN

## 2018-04-29 MED ORDER — TRAMADOL HCL 50 MG PO TABS: 50.0000 mg | ORAL_TABLET | Freq: Four times a day (QID) | ORAL | 0 refills | Status: AC | PRN

## 2018-04-29 NOTE — ED Provider Notes (Signed)
[facility] EMERGENCY DEPARTMENT Provider Note   CSN: [id number] Arrival date & time: 04/29/18  1513     History   Chief Complaint Chief Complaint  Patient presents with  . Groin Pain    HPI [name] is a 52 y.o. male.  Patient with a history of right groin pain ongoing for a week.  Initially just a dull ache and now is become more sharp.  Worse with movement of the right leg at the thigh hip area.  Radiates down the inner thigh.  Some discomfort right inner groin area but no testicular pain.  No prior history of anything similar.  No direct trauma to the area but patient does a lot of walking as a landscaper.  No numbness or weakness or pain in the foot.  No other complaints.  No fevers no hematuria no dysuria no nausea no vomiting no back pain.     Past Medical History:  Diagnosis Date  . Arthritis   . Chronic diastolic CHF (congestive heart failure) ([place])    a. 2014 Echo: preserved EF, grade 1 diastolic dysfunction;  b. 11/2016 LV Gram: EF 55%.  . Diabetes mellitus without complication ([place])    no medications at this time  . GERD (gastroesophageal reflux disease)   . History of NICM (nonischemic cardiomyopathy) ([place])    a. 1998/2005 Caths - nl cors;  b. 2003 EF 15-20%-->s/p Biotronik AICD; c. 2014 Echo: EF 55%;  d. 2016 Low risk MV;  e. 11/2016 Cath: nl cors, EF 55%.  Marland Kitchen Hyperlipidemia   . Hypertension 11/24/11   ECHO-EF52%  . Sleep apnea    cpap setting at 11   . Ventricular tachycardia ([place])    a. s/p Biotronik 540 Lumax DR-T AICD, ser # [id number].    Patient Active Problem List   Diagnosis Date Noted  . ICD (implantable cardioverter-defibrillator) battery depletion 12/03/2017  . Allergic asthma, mild intermittent, uncomplicated 26/83/4196  . Chest pain 12/06/2016  . Diabetes mellitus type 2, diet-controlled ([place]) 12/06/2016  . Cough 09/27/2013  . CAD (coronary artery disease)- minor CAD at cath 2005, low risk Nuc 7/09 09/27/2013  . Non-ischemic  cardiomyopathy- last EF 35-40% by echo Nov 2012 06/05/2013  . Sustained ventricular tachycardia ([place]) 06/05/2013  . ICD (implantable cardioverter-defibrillator) in place 06/05/2013  . Essential hypertension 06/05/2013  . Obesity (BMI 30.0-34.9) 06/05/2013  . Mass of finger of left hand 10/21/2012  . Sinusitis 11/12/2011  . Family hx of colon cancer requiring screening colonoscopy 10/21/2011  . Obstructive apnea 09/14/2011  . Congestive heart failure ([place]) 09/03/2010  . Disorder of teeth and supporting structures 09/03/2010  . Seasonal and perennial allergic rhinitis 09/02/2010  . Esophageal reflux 07/31/2010  . Other and unspecified hyperlipidemia 05/07/2010  . Other abnormal glucose 05/15/2009    Past Surgical History:  Procedure Laterality Date  . CARDIAC CATHETERIZATION    . CARDIAC CATHETERIZATION N/A 12/07/2016   Procedure: Left Heart Cath and Coronary Angiography;  Surgeon: Troy Sine, MD;  Location: [facility];  Service: Cardiovascular;  Laterality: N/A;  . COLONOSCOPY WITH PROPOFOL N/A 07/13/2014   Procedure: COLONOSCOPY WITH PROPOFOL;  Surgeon: Beryle Beams, MD;  [facility];  Service: Endoscopy;  Laterality: N/A;  aicd  . ICD GENERATOR CHANGEOUT N/A 12/03/2017   Procedure: ICD GENERATOR CHANGEOUT;  Surgeon: Sanda Klein, MD;  Location: [facility];  Service: Cardiovascular;  Laterality: N/A;  . INSERT / REPLACE / REMOVE PACEMAKER    . MASS EXCISION  10/21/2012   Procedure: EXCISION MASS;  Surgeon: Mcarthur Rossetti, MD;  Location: [facility];  Service: Orthopedics;  Laterality: Left;  Excision Left Ring Finger Mass  . right hand surgery      pins placed in hand due to accident         Home Medications    Prior to Admission medications   Medication Sig Start Date End Date Taking? Authorizing Provider  allopurinol (ZYLOPRIM) 300 MG tablet Take 1 tablet (300 mg total) by mouth daily. 03/16/17   Croitoru, Mihai, MD  aspirin 325 MG tablet  Take 325 mg by mouth daily.    [provider]  Azelastine-Fluticasone (DYMISTA) 137-50 MCG/ACT SUSP Place 2 sprays into both nostrils at bedtime. Patient not taking: Reported on 11/29/2017 10/06/17   Baird Lyons D, MD  carvedilol (COREG) 25 MG tablet Take 2 tablets (50 mg total) by mouth 2 (two) times daily. 02/12/17   Croitoru, Mihai, MD  cyclobenzaprine (FLEXERIL) 10 MG tablet Take 1 tablet (10 mg total) by mouth 2 (two) times daily as needed for muscle spasms. 04/29/18   Fredia Sorrow, MD  furosemide (LASIX) 40 MG tablet Take 1 tablet (40 mg total) by mouth daily. 02/12/17   Croitoru, Mihai, MD  irbesartan (AVAPRO) 300 MG tablet Take 1 tablet (300 mg total) by mouth daily. 12/03/17 12/03/18  Croitoru, Mihai, MD  naproxen (NAPROSYN) 500 MG tablet Take 500 mg by mouth 2 (two) times daily as needed (arthritis pain).    [provider]  potassium chloride (MICRO-K) 10 MEQ CR capsule Take 1 capsule (10 mEq total) by mouth daily. 02/12/17   Croitoru, Dani Gobble, MD  PRESCRIPTION MEDICATION Inhale into the lungs at bedtime. CPAP    [provider]  rosuvastatin (CRESTOR) 20 MG tablet Take 1 tablet (20 mg total) by mouth daily. 02/12/17   Croitoru, Mihai, MD  traMADol (ULTRAM) 50 MG tablet Take 1 tablet (50 mg total) by mouth every 6 (six) hours as needed. 04/29/18   Fredia Sorrow, MD    Family History Family History  Problem Relation Age of Onset  . Lymphoma Father 65    Social History Social History   Tobacco Use  . Smoking status: Former Smoker    Packs/day: 0.25    Types: Cigarettes    Last attempt to quit: 10/07/2007    Years since quitting: 10.5  . Smokeless tobacco: Never Used  Substance Use Topics  . Alcohol use: No  . Drug use: No     Allergies   Nitroglycerin   Review of Systems Review of Systems  Constitutional: Negative for fever.  HENT: Negative for congestion.   Eyes: Negative for redness.  Respiratory: Negative for shortness of breath.     Cardiovascular: Negative for chest pain.  Gastrointestinal: Negative for abdominal pain.  Genitourinary: Negative for dysuria, flank pain, penile pain, scrotal swelling and testicular pain.  Musculoskeletal: Negative for back pain.  Skin: Negative for rash and wound.  Neurological: Negative for weakness and numbness.  Hematological: Does not bruise/bleed easily.  Psychiatric/Behavioral: Negative for confusion.     Physical Exam Updated Vital Signs BP (!) 157/117 (BP Location: Left Arm) Comment: Measured x2  Pulse 64   Temp 98.3 F (36.8 C) (Oral)   Resp 18   Ht 1.727 m (5\' 8" )   Wt 99.8 kg (220 lb)   SpO2 100%   BMI 33.45 kg/m   Physical Exam  Constitutional: He is oriented to person, place, and time. He appears well-developed and well-nourished.  No distress.  HENT:  Head: Normocephalic and atraumatic.  Mouth/Throat: Oropharynx is clear and moist.  Eyes: Pupils are equal, round, and reactive to light. Conjunctivae and EOM are normal.  Neck: Neck supple.  Cardiovascular: Normal rate, regular rhythm and normal heart sounds.  Pulmonary/Chest: Effort normal and breath sounds normal. No respiratory distress.  Abdominal: Soft. Bowel sounds are normal. There is no tenderness.  Genitourinary: Penis normal.  Genitourinary Comments: No groin hernia.  No testicular swelling or tenderness.  Musculoskeletal: Normal range of motion. He exhibits no edema, tenderness or deformity.  Increased pain with flexion of the right hip.  Distally no evidence of any pain radiating beyond the inner thigh also no neurovascular deficits distally.  Neurological: He is alert and oriented to person, place, and time. No cranial nerve deficit or sensory deficit. He exhibits normal muscle tone. Coordination normal.  Skin: Skin is warm. No rash noted.  Nursing note and vitals reviewed.    ED Treatments / Results  Labs (all labs ordered are listed, but only abnormal results are displayed) Labs Reviewed   URINALYSIS, ROUTINE W REFLEX MICROSCOPIC    EKG None  Radiology Dg Hip Unilat W Or Wo Pelvis 2-3 Views Right  Result Date: 04/29/2018 CLINICAL DATA:  RIGHT groin in pain for 2 weeks EXAM: DG HIP (WITH OR WITHOUT PELVIS) 2-3V RIGHT COMPARISON:  None. FINDINGS: Hips are located. No evidence of pelvic fracture or sacral fracture. Dedicated view of the RIGHT hip demonstrates no femoral neck fracture. IMPRESSION: No pelvic fracture or RIGHT hip fracture Electronically Signed   By: Suzy Bouchard M.D.   On: 04/29/2018 19:44    Procedures Procedures (including critical care time)  Medications Ordered in ED Medications - No data to display   Initial Impression / Assessment and Plan / ED Course  I have reviewed the triage vital signs and the nursing notes.  Pertinent labs & imaging results that were available during my care of the patient were reviewed by me and considered in my medical decision making (see chart for details).     Right groin symptoms seem to be most consistent with a right groin muscle strain.  No evidence of a groin hernia femoral hernia not completely ruled out but has pain with movement of the leg particularly of flexion at the hip and it radiates down the inner part of the thigh towards the knee.  No GI symptoms.  Patient works with Biomedical scientist.  It started off aggravated and has been getting worse.  Will put him at rest muscle relaxers pain medicine and a work note to be out of work for 5 days follow-up with sports medicine.  Everything is isolated to the right groin right inner thigh.  Nothing consistent with a radicular type pain no concern for vascular inflow problem.  No significant swelling.  No groin hernia.  No history of direct trauma.  Final Clinical Impressions(s) / ED Diagnoses   Final diagnoses:  Groin strain, right, initial encounter    ED Discharge Orders        Ordered    traMADol (ULTRAM) 50 MG tablet  Every 6 hours PRN     04/29/18 2032     cyclobenzaprine (FLEXERIL) 10 MG tablet  2 times daily PRN     04/29/18 2032       Fredia Sorrow, MD 04/29/18 2044

## 2018-04-29 NOTE — ED Triage Notes (Signed)
Groin pain x 2 weeks. States he does a lot of walking with his job and that seems to make the pain worse.

## 2018-04-29 NOTE — Discharge Instructions (Addendum)
Follow-up with sports medicine upstairs.  Work note provided to be out of work for 5 days.  Rest the leg is much as possible.  Take the tramadol for pain take the Flexeril to relax the muscles.  Return for any new or worse symptoms.

## 2018-05-10 ENCOUNTER — Ambulatory Visit (INDEPENDENT_AMBULATORY_CARE_PROVIDER_SITE_OTHER): Payer: Medicare Other | Admitting: *Deleted

## 2018-05-10 DIAGNOSIS — I428 Other cardiomyopathies: Secondary | ICD-10-CM

## 2018-05-10 LAB — CUP PACEART REMOTE DEVICE CHECK
Battery Voltage: 3.1 V
Brady Statistic AP VP Percent: 0 %
Brady Statistic AP VS Percent: 65 %
Brady Statistic AS VP Percent: 0 %
Brady Statistic RA Percent Paced: 66 %
Brady Statistic RV Percent Paced: 0 %
HIGH POWER IMPEDANCE MEASURED VALUE: 67 Ohm
Implantable Lead Implant Date: 20100803
Implantable Lead Location: 753860
Implantable Lead Model: 350
Implantable Lead Model: 351
Implantable Lead Serial Number: 28626454
Lead Channel Impedance Value: 486 Ohm
Lead Channel Pacing Threshold Amplitude: 0.7 V
Lead Channel Pacing Threshold Amplitude: 0.9 V
Lead Channel Pacing Threshold Pulse Width: 0.4 ms
Lead Channel Pacing Threshold Pulse Width: 0.4 ms
Lead Channel Setting Pacing Amplitude: 2 V
Lead Channel Setting Sensing Sensitivity: 0.8 mV
MDC IDC LEAD IMPLANT DT: 20100803
MDC IDC LEAD LOCATION: 753859
MDC IDC LEAD SERIAL: 10367086
MDC IDC MSMT BATTERY REMAINING PERCENTAGE: 100 %
MDC IDC MSMT LEADCHNL RA IMPEDANCE VALUE: 603 Ohm
MDC IDC PG IMPLANT DT: 20181207
MDC IDC SESS DTM: 20190523053012
MDC IDC SET LEADCHNL RV PACING AMPLITUDE: 2 V
MDC IDC SET LEADCHNL RV PACING PULSEWIDTH: 0.4 ms
MDC IDC STAT BRADY AS VS PERCENT: 34 %
Pulse Gen Serial Number: 61019047

## 2018-05-10 NOTE — Progress Notes (Signed)
Remote ICD transmission.   

## 2018-05-15 ENCOUNTER — Other Ambulatory Visit: Payer: Self-pay | Admitting: Cardiovascular Disease

## 2018-05-16 NOTE — Telephone Encounter (Signed)
Rx has been sent to the pharmacy electronically. ° °

## 2018-05-28 ENCOUNTER — Encounter (HOSPITAL_COMMUNITY): Payer: Self-pay | Admitting: *Deleted

## 2018-05-28 ENCOUNTER — Other Ambulatory Visit: Payer: Self-pay

## 2018-05-28 ENCOUNTER — Emergency Department (HOSPITAL_COMMUNITY)
Admission: EM | Admit: 2018-05-28 | Discharge: 2018-05-28 | Disposition: A | Discharge status: Home / Self Care | Payer: Medicare Other | Attending: Emergency Medicine | Admitting: Emergency Medicine

## 2018-05-28 ENCOUNTER — Emergency Department (HOSPITAL_COMMUNITY): Payer: Medicare Other

## 2018-05-28 DIAGNOSIS — I110 Hypertensive heart disease with heart failure: Secondary | ICD-10-CM | POA: Diagnosis not present

## 2018-05-28 DIAGNOSIS — I2510 Atherosclerotic heart disease of native coronary artery without angina pectoris: Secondary | ICD-10-CM | POA: Diagnosis not present

## 2018-05-28 DIAGNOSIS — I5032 Chronic diastolic (congestive) heart failure: Secondary | ICD-10-CM | POA: Diagnosis not present

## 2018-05-28 DIAGNOSIS — Z4502 Encounter for adjustment and management of automatic implantable cardiac defibrillator: Secondary | ICD-10-CM | POA: Diagnosis present

## 2018-05-28 DIAGNOSIS — Z87891 Personal history of nicotine dependence: Secondary | ICD-10-CM | POA: Insufficient documentation

## 2018-05-28 DIAGNOSIS — Y712 Prosthetic and other implants, materials and accessory cardiovascular devices associated with adverse incidents: Secondary | ICD-10-CM | POA: Insufficient documentation

## 2018-05-28 DIAGNOSIS — Z79899 Other long term (current) drug therapy: Secondary | ICD-10-CM | POA: Diagnosis not present

## 2018-05-28 DIAGNOSIS — T829XXA Unspecified complication of cardiac and vascular prosthetic device, implant and graft, initial encounter: Secondary | ICD-10-CM | POA: Insufficient documentation

## 2018-05-28 DIAGNOSIS — Z95810 Presence of automatic (implantable) cardiac defibrillator: Secondary | ICD-10-CM | POA: Insufficient documentation

## 2018-05-28 DIAGNOSIS — J45909 Unspecified asthma, uncomplicated: Secondary | ICD-10-CM | POA: Diagnosis not present

## 2018-05-28 DIAGNOSIS — E119 Type 2 diabetes mellitus without complications: Secondary | ICD-10-CM | POA: Insufficient documentation

## 2018-05-28 DIAGNOSIS — Z7982 Long term (current) use of aspirin: Secondary | ICD-10-CM | POA: Diagnosis not present

## 2018-05-28 LAB — BASIC METABOLIC PANEL
Anion gap: 9 (ref 5–15)
BUN: 13 mg/dL (ref 6–20)
CALCIUM: 8.4 mg/dL — AB (ref 8.9–10.3)
CHLORIDE: 109 mmol/L (ref 101–111)
CO2: 26 mmol/L (ref 22–32)
CREATININE: 0.95 mg/dL (ref 0.61–1.24)
GFR calc Af Amer: >60 mL/min (ref >60)
Glucose, Bld: 97 mg/dL (ref 65–99)
Potassium: 4 mmol/L (ref 3.5–5.1)
SODIUM: 144 mmol/L (ref 135–145)

## 2018-05-28 LAB — CBC WITH DIFFERENTIAL/PLATELET
Abs Immature Granulocytes: 0 10*3/uL (ref 0.0–0.1)
BASOS PCT: 1 %
Basophils Absolute: 0.1 10*3/uL (ref 0.0–0.1)
EOS ABS: 0.3 10*3/uL (ref 0.0–0.7)
EOS PCT: 4 %
HEMATOCRIT: 43 % (ref 39.0–52.0)
Hemoglobin: 14.8 g/dL (ref 13.0–17.0)
Immature Granulocytes: 0 %
LYMPHS ABS: 2.9 10*3/uL (ref 0.7–4.0)
Lymphocytes Relative: 35 %
MCH: 31.3 pg (ref 26.0–34.0)
MCHC: 34.4 g/dL (ref 30.0–36.0)
MCV: 90.9 fL (ref 78.0–100.0)
Monocytes Absolute: 0.6 10*3/uL (ref 0.1–1.0)
Monocytes Relative: 8 %
Neutro Abs: 4.2 10*3/uL (ref 1.7–7.7)
Neutrophils Relative %: 52 %
Platelets: 180 10*3/uL (ref 150–400)
RBC: 4.73 MIL/uL (ref 4.22–5.81)
RDW: 13.2 % (ref 11.5–15.5)
WBC: 8.1 10*3/uL (ref 4.0–10.5)

## 2018-05-28 LAB — I-STAT TROPONIN, ED: Troponin i, poc: 0.01 ng/mL (ref 0.00–0.08)

## 2018-05-28 LAB — MAGNESIUM: Magnesium: 2.2 mg/dL (ref 1.7–2.4)

## 2018-05-28 NOTE — ED Triage Notes (Signed)
PT reports he jumped out of bed and his AICD  Fired. Pt denies any CP,Dizziness, N/V.

## 2018-05-28 NOTE — ED Notes (Signed)
Got patient undress on the monitor patient is resting with call bell in reach 

## 2018-05-28 NOTE — ED Provider Notes (Signed)
[facility] EMERGENCY DEPARTMENT Provider Note   CSN: [id number] Arrival date & time: 05/28/18  8657     History   Chief Complaint Chief Complaint  Patient presents with  . AICD Problem    HPI [name] is a 52 y.o. male.  Pt presents to the ED today with his AICD firing.  He has a hx of nonischemic cardiomyopathy with a hx of VT.   He has a Biotronik that was replaced by [name] in December of 2018.  It has not gone off since it was replaced.  He said he was chasing his dog around the house when it went off.  He denies any cp or dizziness before or after the event.  He does have some sob.  He denies any f/c.  He has had allergy sx.     Past Medical History:  Diagnosis Date  . Arthritis   . Chronic diastolic CHF (congestive heart failure) ([place])    a. 2014 Echo: preserved EF, grade 1 diastolic dysfunction;  b. 11/2016 LV Gram: EF 55%.  . Diabetes mellitus without complication ([place])    no medications at this time  . GERD (gastroesophageal reflux disease)   . History of NICM (nonischemic cardiomyopathy) ([place])    a. 1998/2005 Caths - nl cors;  b. 2003 EF 15-20%-->s/p Biotronik AICD; c. 2014 Echo: EF 55%;  d. 2016 Low risk MV;  e. 11/2016 Cath: nl cors, EF 55%.  Marland Kitchen Hyperlipidemia   . Hypertension 11/24/11   ECHO-EF52%  . Sleep apnea    cpap setting at 11   . Ventricular tachycardia ([place])    a. s/p Biotronik 540 Lumax DR-T AICD, ser # [id number].    Patient Active Problem List   Diagnosis Date Noted  . ICD (implantable cardioverter-defibrillator) battery depletion 12/03/2017  . Allergic asthma, mild intermittent, uncomplicated 28/41/3244  . Chest pain 12/06/2016  . Diabetes mellitus type 2, diet-controlled ([place]) 12/06/2016  . Cough 09/27/2013  . CAD (coronary artery disease)- minor CAD at cath 2005, low risk Nuc 7/09 09/27/2013  . Non-ischemic cardiomyopathy- last EF 35-40% by echo Nov 2012 06/05/2013  . Sustained ventricular tachycardia ([place])  06/05/2013  . ICD (implantable cardioverter-defibrillator) in place 06/05/2013  . Essential hypertension 06/05/2013  . Obesity (BMI 30.0-34.9) 06/05/2013  . Mass of finger of left hand 10/21/2012  . Sinusitis 11/12/2011  . Family hx of colon cancer requiring screening colonoscopy 10/21/2011  . Obstructive apnea 09/14/2011  . Congestive heart failure ([place]) 09/03/2010  . Disorder of teeth and supporting structures 09/03/2010  . Seasonal and perennial allergic rhinitis 09/02/2010  . Esophageal reflux 07/31/2010  . Other and unspecified hyperlipidemia 05/07/2010  . Other abnormal glucose 05/15/2009    Past Surgical History:  Procedure Laterality Date  . CARDIAC CATHETERIZATION    . CARDIAC CATHETERIZATION N/A 12/07/2016   Procedure: Left Heart Cath and Coronary Angiography;  Surgeon: Troy Sine, MD;  Location: [facility] CV [facility];  Service: Cardiovascular;  Laterality: N/A;  . COLONOSCOPY WITH PROPOFOL N/A 07/13/2014   Procedure: COLONOSCOPY WITH PROPOFOL;  Surgeon: Beryle Beams, MD;  Location: [facility];  Service: Endoscopy;  Laterality: N/A;  aicd  . ICD GENERATOR CHANGEOUT N/A 12/03/2017   Procedure: ICD GENERATOR CHANGEOUT;  Surgeon: Sanda Klein, MD;  Location: [facility];  Service: Cardiovascular;  Laterality: N/A;  . INSERT / REPLACE / REMOVE PACEMAKER    . MASS EXCISION  10/21/2012   Procedure: EXCISION MASS;  Surgeon: Mcarthur Rossetti, MD;  Location: [facility];  Service: Orthopedics;  Laterality: Left;  Excision Left Ring Finger Mass  . right hand surgery      pins placed in hand due to accident         Home Medications    Prior to Admission medications   Medication Sig Start Date End Date Taking? Authorizing Provider  allopurinol (ZYLOPRIM) 300 MG tablet Take 1 tablet (300 mg total) by mouth daily. 03/16/17   Croitoru, Mihai, MD  aspirin 325 MG tablet Take 325 mg by mouth daily.    [provider]  Azelastine-Fluticasone (DYMISTA) 137-50  MCG/ACT SUSP Place 2 sprays into both nostrils at bedtime. Patient not taking: Reported on 11/29/2017 10/06/17   Baird Lyons D, MD  carvedilol (COREG) 25 MG tablet Take 2 tablets (50 mg total) by mouth 2 (two) times daily. 02/12/17   Croitoru, Mihai, MD  cyclobenzaprine (FLEXERIL) 10 MG tablet Take 1 tablet (10 mg total) by mouth 2 (two) times daily as needed for muscle spasms. 04/29/18   Fredia Sorrow, MD  furosemide (LASIX) 40 MG tablet TAKE 1 TABLET(40 MG) BY MOUTH DAILY 05/16/18   Croitoru, Mihai, MD  irbesartan (AVAPRO) 300 MG tablet Take 1 tablet (300 mg total) by mouth daily. 12/03/17 12/03/18  Croitoru, Mihai, MD  naproxen (NAPROSYN) 500 MG tablet Take 500 mg by mouth 2 (two) times daily as needed (arthritis pain).    [provider]  potassium chloride (MICRO-K) 10 MEQ CR capsule TAKE 1 CAPSULE(10 MEQ) BY MOUTH DAILY 05/16/18   Croitoru, Mihai, MD  PRESCRIPTION MEDICATION Inhale into the lungs at bedtime. CPAP    [provider]  rosuvastatin (CRESTOR) 20 MG tablet Take 1 tablet (20 mg total) by mouth daily. 02/12/17   Croitoru, Mihai, MD  traMADol (ULTRAM) 50 MG tablet Take 1 tablet (50 mg total) by mouth every 6 (six) hours as needed. 04/29/18   Fredia Sorrow, MD    Family History Family History  Problem Relation Age of Onset  . Lymphoma Father 33    Social History Social History   Tobacco Use  . Smoking status: Former Smoker    Packs/day: 0.25    Types: Cigarettes    Last attempt to quit: 10/07/2007    Years since quitting: 10.6  . Smokeless tobacco: Never Used  Substance Use Topics  . Alcohol use: No  . Drug use: No     Allergies   Nitroglycerin   Review of Systems Review of Systems  Cardiovascular:       AICD fired  All other systems reviewed and are negative.    Physical Exam Updated Vital Signs BP (!) 154/121   Pulse 85   Temp (!) 97.5 F (36.4 C) (Oral)   Resp 18   Ht 5\' 8"  (1.727 m)   Wt 99.8 kg (220 lb)   SpO2 97%   BMI 33.45  kg/m   Physical Exam  Constitutional: He is oriented to person, place, and time. He appears well-developed and well-nourished.  HENT:  Head: Normocephalic and atraumatic.  Right Ear: External ear normal.  Left Ear: External ear normal.  Nose: Rhinorrhea present.  Mouth/Throat: Oropharynx is clear and moist.  Eyes: Pupils are equal, round, and reactive to light. Conjunctivae and EOM are normal.  Neck: Normal range of motion. Neck supple.  Cardiovascular: Normal rate, regular rhythm, normal heart sounds and intact distal pulses.  Pulmonary/Chest: Effort normal and breath sounds normal.  Abdominal: Soft. Bowel sounds are normal.  Musculoskeletal: Normal range of motion.  Neurological: He is alert and oriented to person, place, and time.  Skin: Skin is warm. Capillary refill takes less than 2 seconds.  Psychiatric: He has a normal mood and affect. His behavior is normal. Judgment and thought content normal.  Nursing note and vitals reviewed.    ED Treatments / Results  Labs (all labs ordered are listed, but only abnormal results are displayed) Labs Reviewed  BASIC METABOLIC PANEL - Abnormal; Notable for the following components:      Result Value   Calcium 8.4 (*)    All other components within normal limits  CBC WITH DIFFERENTIAL/PLATELET  MAGNESIUM  I-STAT TROPONIN, ED  CBG MONITORING, ED    EKG EKG Interpretation  Date/Time:  Saturday May 28 2018 08:26:35 EDT Ventricular Rate:  69 PR Interval:    QRS Duration: 109 QT Interval:  418 QTC Calculation: 448 R Axis:   40 Text Interpretation:  Atrial-paced rhythm Abnormal T, consider ischemia, diffuse leads Borderline ST elevation, anterior leads No significant change since last tracing Confirmed by Isla Pence [phone]) on 05/28/2018 10:33:08 AM   Radiology Dg Chest Port 1 View  Result Date: 05/28/2018 CLINICAL DATA:  The patient's AICD fired. EXAM: PORTABLE CHEST 1 VIEW COMPARISON:  December 06, 2016 FINDINGS: The  patient has a dual lead AICD device which is in good position. No pneumothorax. The cardiomediastinal silhouette, pleura, and lungs are unremarkable. IMPRESSION: No active disease. Electronically Signed   By: Dorise Bullion III M.D   On: 05/28/2018 08:49    Procedures Procedures (including critical care time)  Medications Ordered in ED Medications - No data to display   Initial Impression / Assessment and Plan / ED Course  I have reviewed the triage vital signs and the nursing notes.  Pertinent labs & imaging results that were available during my care of the patient were reviewed by me and considered in my medical decision making (see chart for details).    B[employer]ronix rep called in and evaluated pacemaker.  There was no evidence of the device firing.  As there was no shock and pt did not feel cp or dizziness, pt is stable for d/c.  Return if worse.  F/u with cardiology.     Final Clinical Impressions(s) / ED Diagnoses   Final diagnoses:  AICD problem    ED Discharge Orders    None       Isla Pence, MD 05/28/18 1128

## 2018-05-28 NOTE — ED Notes (Signed)
Declined W/C at D/C and was escorted to lobby by RN. 

## 2018-05-28 NOTE — ED Triage Notes (Signed)
[employer] rep. To come in to ED to eval AICD. Unable to use devices  In the ED . Devices not connecting . EDP and Cards aware

## 2018-06-05 ENCOUNTER — Encounter (HOSPITAL_BASED_OUTPATIENT_CLINIC_OR_DEPARTMENT_OTHER): Payer: Self-pay | Admitting: Emergency Medicine

## 2018-06-05 ENCOUNTER — Emergency Department (HOSPITAL_BASED_OUTPATIENT_CLINIC_OR_DEPARTMENT_OTHER)
Admission: EM | Admit: 2018-06-05 | Discharge: 2018-06-05 | Disposition: A | Discharge status: Home / Self Care | Payer: Medicare Other | Attending: Emergency Medicine | Admitting: Emergency Medicine

## 2018-06-05 ENCOUNTER — Other Ambulatory Visit: Payer: Self-pay

## 2018-06-05 DIAGNOSIS — I5032 Chronic diastolic (congestive) heart failure: Secondary | ICD-10-CM | POA: Insufficient documentation

## 2018-06-05 DIAGNOSIS — R1013 Epigastric pain: Secondary | ICD-10-CM | POA: Diagnosis not present

## 2018-06-05 DIAGNOSIS — K279 Peptic ulcer, site unspecified, unspecified as acute or chronic, without hemorrhage or perforation: Secondary | ICD-10-CM

## 2018-06-05 DIAGNOSIS — I110 Hypertensive heart disease with heart failure: Secondary | ICD-10-CM | POA: Insufficient documentation

## 2018-06-05 DIAGNOSIS — E119 Type 2 diabetes mellitus without complications: Secondary | ICD-10-CM | POA: Insufficient documentation

## 2018-06-05 DIAGNOSIS — Z87891 Personal history of nicotine dependence: Secondary | ICD-10-CM | POA: Diagnosis not present

## 2018-06-05 DIAGNOSIS — Z79899 Other long term (current) drug therapy: Secondary | ICD-10-CM | POA: Diagnosis not present

## 2018-06-05 DIAGNOSIS — R109 Unspecified abdominal pain: Secondary | ICD-10-CM | POA: Diagnosis present

## 2018-06-05 DIAGNOSIS — Z7982 Long term (current) use of aspirin: Secondary | ICD-10-CM | POA: Insufficient documentation

## 2018-06-05 LAB — CBC WITH DIFFERENTIAL/PLATELET
BASOS ABS: 0 10*3/uL (ref 0.0–0.1)
BASOS PCT: 0 %
Eosinophils Absolute: 0.1 10*3/uL (ref 0.0–0.7)
Eosinophils Relative: 1 %
HEMATOCRIT: 44.2 % (ref 39.0–52.0)
HEMOGLOBIN: 16.1 g/dL (ref 13.0–17.0)
Lymphocytes Relative: 25 %
Lymphs Abs: 2.8 10*3/uL (ref 0.7–4.0)
MCH: 32.1 pg (ref 26.0–34.0)
MCHC: 36.4 g/dL — AB (ref 30.0–36.0)
MCV: 88 fL (ref 78.0–100.0)
MONOS PCT: 11 %
Monocytes Absolute: 1.3 10*3/uL — ABNORMAL HIGH (ref 0.1–1.0)
Neutro Abs: 7.1 10*3/uL (ref 1.7–7.7)
Neutrophils Relative %: 63 %
Platelets: 203 10*3/uL (ref 150–400)
RBC: 5.02 MIL/uL (ref 4.22–5.81)
RDW: 13 % (ref 11.5–15.5)
WBC: 11.3 10*3/uL — ABNORMAL HIGH (ref 4.0–10.5)

## 2018-06-05 LAB — COMPREHENSIVE METABOLIC PANEL
ALK PHOS: 64 U/L (ref 38–126)
ALT: 35 U/L (ref 17–63)
ANION GAP: 10 (ref 5–15)
AST: 41 U/L (ref 15–41)
Albumin: 4 g/dL (ref 3.5–5.0)
BUN: 12 mg/dL (ref 6–20)
CALCIUM: 8.6 mg/dL — AB (ref 8.9–10.3)
CO2: 28 mmol/L (ref 22–32)
Chloride: 103 mmol/L (ref 101–111)
Creatinine, Ser: 1.03 mg/dL (ref 0.61–1.24)
GFR calc Af Amer: >60 mL/min (ref >60)
GFR calc non Af Amer: >60 mL/min (ref >60)
Glucose, Bld: 98 mg/dL (ref 65–99)
Potassium: 3.5 mmol/L (ref 3.5–5.1)
SODIUM: 141 mmol/L (ref 135–145)
Total Bilirubin: 0.5 mg/dL (ref 0.3–1.2)
Total Protein: 7.6 g/dL (ref 6.5–8.1)

## 2018-06-05 LAB — TROPONIN I

## 2018-06-05 LAB — LIPASE, BLOOD: Lipase: 26 U/L (ref 11–51)

## 2018-06-05 MED ORDER — GI COCKTAIL ~~LOC~~: 30.0000 mL | Freq: Once | ORAL | Status: DC

## 2018-06-05 MED ORDER — OMEPRAZOLE 40 MG PO CPDR: 40.0000 mg | DELAYED_RELEASE_CAPSULE | Freq: Every day | ORAL | 0 refills | Status: AC

## 2018-06-05 MED ORDER — GI COCKTAIL ~~LOC~~
30.0000 mL | Freq: Once | ORAL | Status: AC
  Administered 2018-06-05: 30 mL via ORAL
  Filled 2018-06-05: qty 30

## 2018-06-05 NOTE — ED Notes (Signed)
Pt reports drinking wine last PM and woke up this morning feeling hot and abdominal discomfort. Different positions make its better.

## 2018-06-05 NOTE — ED Provider Notes (Signed)
[facility] EMERGENCY DEPARTMENT Provider Note   CSN: [id number] Arrival date & time: 06/05/18  1716     History   Chief Complaint Chief Complaint  Patient presents with  . Abdominal Pain    HPI [name] is a 52 y.o. male.  HPI  52 y/o comes in with cc of abd pain. Pt has hx of CHF, DM, HTN, HL. He reports that the pain started. Pain is described as epigastric pain, sharp, nonradiating and he has no hx of similar pain in the past.  Patient is unsure if the pain is worse with food.  Patient has had nausea, but has no vomiting and he his BM has been normal.  Pt denies n/v/f/c/diarrhea. Last BM was y'day.  Past Medical History:  Diagnosis Date  . Arthritis   . Chronic diastolic CHF (congestive heart failure) ([place])    a. 2014 Echo: preserved EF, grade 1 diastolic dysfunction;  b. 11/2016 LV Gram: EF 55%.  . Diabetes mellitus without complication ([place])    no medications at this time  . GERD (gastroesophageal reflux disease)   . History of NICM (nonischemic cardiomyopathy) ([place])    a. 1998/2005 Caths - nl cors;  b. 2003 EF 15-20%-->s/p Biotronik AICD; c. 2014 Echo: EF 55%;  d. 2016 Low risk MV;  e. 11/2016 Cath: nl cors, EF 55%.  Marland Kitchen Hyperlipidemia   . Hypertension 11/24/11   ECHO-EF52%  . Sleep apnea    cpap setting at 11   . Ventricular tachycardia ([place])    a. s/p Biotronik 540 Lumax DR-T AICD, ser # [id number].    Patient Active Problem List   Diagnosis Date Noted  . ICD (implantable cardioverter-defibrillator) battery depletion 12/03/2017  . Allergic asthma, mild intermittent, uncomplicated 88/41/6606  . Chest pain 12/06/2016  . Diabetes mellitus type 2, diet-controlled ([place]) 12/06/2016  . Cough 09/27/2013  . CAD (coronary artery disease)- minor CAD at cath 2005, low risk Nuc 7/09 09/27/2013  . Non-ischemic cardiomyopathy- last EF 35-40% by echo Nov 2012 06/05/2013  . Sustained ventricular tachycardia ([place]) 06/05/2013  . ICD (implantable  cardioverter-defibrillator) in place 06/05/2013  . Essential hypertension 06/05/2013  . Obesity (BMI 30.0-34.9) 06/05/2013  . Mass of finger of left hand 10/21/2012  . Sinusitis 11/12/2011  . Family hx of colon cancer requiring screening colonoscopy 10/21/2011  . Obstructive apnea 09/14/2011  . Congestive heart failure ([place]) 09/03/2010  . Disorder of teeth and supporting structures 09/03/2010  . Seasonal and perennial allergic rhinitis 09/02/2010  . Esophageal reflux 07/31/2010  . Other and unspecified hyperlipidemia 05/07/2010  . Other abnormal glucose 05/15/2009    Past Surgical History:  Procedure Laterality Date  . CARDIAC CATHETERIZATION    . CARDIAC CATHETERIZATION N/A 12/07/2016   Procedure: Left Heart Cath and Coronary Angiography;  Surgeon: Troy Sine, MD;  Location: [facility] CV LAB;  Service: Cardiovascular;  Laterality: N/A;  . COLONOSCOPY WITH PROPOFOL N/A 07/13/2014   Procedure: COLONOSCOPY WITH PROPOFOL;  Surgeon: Beryle Beams, MD;  Location[facility];  Service: Endoscopy;  Laterality: N/A;  aicd  . ICD GENERATOR CHANGEOUT N/A 12/03/2017   Procedure: ICD GENERATOR CHANGEOUT;  Surgeon: Sanda Klein, MD;  Location: [facility] LAB;  Service: Cardiovascular;  Laterality: N/A;  . INSERT / REPLACE / REMOVE PACEMAKER    . MASS EXCISION  10/21/2012   Procedure: EXCISION MASS;  Surgeon: Mcarthur Rossetti, MD;  Location: [facility];  Service: Orthopedics;  Laterality: Left;  Excision Left Ring Finger Mass  .  right hand surgery      pins placed in hand due to accident         Home Medications    Prior to Admission medications   Medication Sig Start Date End Date Taking? Authorizing Provider  allopurinol (ZYLOPRIM) 300 MG tablet Take 1 tablet (300 mg total) by mouth daily. 03/16/17   Croitoru, Mihai, MD  aspirin 325 MG tablet Take 325 mg by mouth daily.    [provider]  Azelastine-Fluticasone (DYMISTA) 137-50 MCG/ACT SUSP Place 2 sprays into  both nostrils at bedtime. Patient not taking: Reported on 11/29/2017 10/06/17   Baird Lyons D, MD  carvedilol (COREG) 25 MG tablet Take 2 tablets (50 mg total) by mouth 2 (two) times daily. 02/12/17   Croitoru, Mihai, MD  cyclobenzaprine (FLEXERIL) 10 MG tablet Take 1 tablet (10 mg total) by mouth 2 (two) times daily as needed for muscle spasms. 04/29/18   Fredia Sorrow, MD  furosemide (LASIX) 40 MG tablet TAKE 1 TABLET(40 MG) BY MOUTH DAILY 05/16/18   Croitoru, Mihai, MD  irbesartan (AVAPRO) 300 MG tablet Take 1 tablet (300 mg total) by mouth daily. 12/03/17 12/03/18  Croitoru, Mihai, MD  naproxen (NAPROSYN) 500 MG tablet Take 500 mg by mouth 2 (two) times daily as needed (arthritis pain).    [provider]  omeprazole (PRILOSEC) 40 MG capsule Take 1 capsule (40 mg total) by mouth daily. 06/05/18   Varney Biles, MD  potassium chloride (MICRO-K) 10 MEQ CR capsule TAKE 1 CAPSULE(10 MEQ) BY MOUTH DAILY 05/16/18   Croitoru, Mihai, MD  PRESCRIPTION MEDICATION Inhale into the lungs at bedtime. CPAP    [provider]  rosuvastatin (CRESTOR) 20 MG tablet Take 1 tablet (20 mg total) by mouth daily. 02/12/17   Croitoru, Mihai, MD  traMADol (ULTRAM) 50 MG tablet Take 1 tablet (50 mg total) by mouth every 6 (six) hours as needed. 04/29/18   Fredia Sorrow, MD    Family History Family History  Problem Relation Age of Onset  . Lymphoma Father 60    Social History Social History   Tobacco Use  . Smoking status: Former Smoker    Packs/day: 0.25    Types: Cigarettes    Last attempt to quit: 10/07/2007    Years since quitting: 10.6  . Smokeless tobacco: Never Used  Substance Use Topics  . Alcohol use: No  . Drug use: No     Allergies   Nitroglycerin   Review of Systems Review of Systems  Constitutional: Positive for activity change.  Respiratory: Negative for shortness of breath.   Cardiovascular: Negative for chest pain.  Gastrointestinal: Positive for abdominal pain.    Allergic/Immunologic: Negative for immunocompromised state.  Neurological: Negative for dizziness.  Hematological: Does not bruise/bleed easily.  All other systems reviewed and are negative.    Physical Exam Updated Vital Signs BP (!) 154/93 (BP Location: Right Arm)   Pulse 84   Temp 98.6 F (37 C) (Oral)   Resp 20   Ht 5\' 8"  (1.727 m)   Wt 99.8 kg (220 lb)   SpO2 97%   BMI 33.45 kg/m   Physical Exam  Constitutional: He is oriented to person, place, and time. He appears well-developed.  HENT:  Head: Atraumatic.  Neck: Neck supple.  Cardiovascular: Normal rate.  Pulmonary/Chest: Effort normal.  Abdominal: There is tenderness in the epigastric area. There is no rebound, no guarding, no tenderness at McBurney's point and negative Murphy's sign. No hernia.  Neurological: He is alert  and oriented to person, place, and time.  Skin: Skin is warm.  Nursing note and vitals reviewed.    ED Treatments / Results  Labs (all labs ordered are listed, but only abnormal results are displayed) Labs Reviewed  COMPREHENSIVE METABOLIC PANEL - Abnormal; Notable for the following components:      Result Value   Calcium 8.6 (*)    All other components within normal limits  CBC WITH DIFFERENTIAL/PLATELET - Abnormal; Notable for the following components:   WBC 11.3 (*)    MCHC 36.4 (*)    Monocytes Absolute 1.3 (*)    All other components within normal limits  LIPASE, BLOOD  TROPONIN I  TROPONIN I    EKG EKG Interpretation  Date/Time:  Sunday June 05 2018 19:16:33 EDT Ventricular Rate:  67 PR Interval:    QRS Duration: 117 QT Interval:  420 QTC Calculation: 444 R Axis:   55 Text Interpretation:  Sinus rhythm Nonspecific intraventricular conduction delay Nonspecific T abnormalities, diffuse leads No acute changes Nonspecific ST and T wave abnormality No significant change since last tracing Reconfirmed by Ifeanyi Mickelson ([id number]) on 06/05/2018 7:56:46 PM   Radiology No results  found.  Procedures Procedures (including critical care time)  Medications Ordered in ED Medications  gi cocktail (Maalox,Lidocaine,Donnatal) (30 mLs Oral Given 06/05/18 2209)     Initial Impression / Assessment and Plan / ED Course  I have reviewed the triage vital signs and the nursing notes.  Pertinent labs & imaging results that were available during my care of the patient were reviewed by me and considered in my medical decision making (see chart for details).     52 y/o with hx of DM, CHF comes in with cc of abd pain.  DDx includes: Pancreatitis Hepatobiliary pathology including cholecystitis Gastritis/PUD SBO ACS syndrome  Patient on exam is having epigastric tenderness without any peritoneal signs.  He has some risk factors for ACS, therefore we will get delta troponin because of his weak symptoms.  Final Clinical Impressions(s) / ED Diagnoses   Final diagnoses:  Epigastric pain  PUD (peptic ulcer disease)    ED Discharge Orders        Ordered    omeprazole (PRILOSEC) 40 MG capsule  Daily     06 /09/19 2301       Varney Biles, MD 06/05/18 2328

## 2018-06-05 NOTE — ED Triage Notes (Signed)
Patient states that he is having generalized abdominal pain - he reports that he is nauseated but denies any vomiting - LBM yesterday

## 2018-06-05 NOTE — ED Notes (Signed)
ED Provider at bedside. 

## 2018-06-05 NOTE — Discharge Instructions (Signed)
We saw you in the ER for the upper abdominal pain. All the results in the ER are normal. We are not sure what is causing your symptoms -but we suspect that you might have gastritis.  Please see your primary care doctor in 1 week.  The workup in the ER is not complete, and is limited to screening for life threatening and emergent conditions only, so please see a primary care doctor for further evaluation. Please return to the ER if you have start having chest pain, shortness of breath, pain radiating to your jaw, shoulder, or back, sweats or fainting. Otherwise see the Cardiologist or your primary care doctor as requested.

## 2018-06-15 ENCOUNTER — Telehealth: Payer: Self-pay | Admitting: Cardiology

## 2018-06-15 NOTE — Telephone Encounter (Signed)
Spoke w/ pt and informed him that his home monitor has not updated nightly. Pt stated that his wife will unplug the monitor sometimes he is going call home to make sure the home monitor is plugged up.

## 2018-06-17 NOTE — Telephone Encounter (Signed)
2nd attempt  Spoke w/ pt and informed him that his home monitor is still not connected. Pt stated that he has plugged it in on Thursday 06-16-2018. I informed pt that the monitor is still updating and that I will call back on Monday if the monitor has not connected by then. Pt verbalized understanding.

## 2018-06-20 NOTE — Telephone Encounter (Signed)
Home monitoring is updating.

## 2018-07-06 ENCOUNTER — Emergency Department (HOSPITAL_COMMUNITY)
Admission: EM | Admit: 2018-07-06 | Discharge: 2018-07-28 | Disposition: E | Payer: Medicare Other | Attending: Emergency Medicine | Admitting: Emergency Medicine

## 2018-07-06 DIAGNOSIS — I10 Essential (primary) hypertension: Secondary | ICD-10-CM | POA: Diagnosis not present

## 2018-07-06 DIAGNOSIS — Z79899 Other long term (current) drug therapy: Secondary | ICD-10-CM | POA: Diagnosis not present

## 2018-07-06 DIAGNOSIS — I469 Cardiac arrest, cause unspecified: Secondary | ICD-10-CM

## 2018-07-06 DIAGNOSIS — J4520 Mild intermittent asthma, uncomplicated: Secondary | ICD-10-CM | POA: Diagnosis not present

## 2018-07-06 DIAGNOSIS — Z7982 Long term (current) use of aspirin: Secondary | ICD-10-CM | POA: Insufficient documentation

## 2018-07-06 DIAGNOSIS — Z87891 Personal history of nicotine dependence: Secondary | ICD-10-CM | POA: Insufficient documentation

## 2018-07-06 DIAGNOSIS — I2510 Atherosclerotic heart disease of native coronary artery without angina pectoris: Secondary | ICD-10-CM | POA: Diagnosis not present

## 2018-07-07 MED FILL — Medication: Qty: 1 | Status: AC

## 2018-07-14 ENCOUNTER — Other Ambulatory Visit: Payer: Self-pay | Admitting: Cardiology

## 2018-07-28 NOTE — ED Provider Notes (Addendum)
[facility] EMERGENCY DEPARTMENT Provider Note   CSN: [id number] Arrival date & time: July 07, 2018  0138     History   Chief Complaint Chief Complaint  Patient presents with  . Cardiac Arrest    HPI [name] is a 52 y.o. male.  Patient brought to the emergency department from home by EMS.  Patient reportedly got into bed with his wife and she noticed that his body jerked twice.  She thought he might of had his ICD go off when this occurred.  As soon as this occurred he went completely unresponsive.  He was intubated by EMS, CPR administered.  Patient received a total of 8 doses of epinephrine during transport.  EMS report that they administered a shock 2 times for V. fib and upon arrival to the ER he has been persistently in PEA.  They did not ever achieve review of spontaneous circulation.     Past Medical History:  Diagnosis Date  . Arthritis   . Chronic diastolic CHF (congestive heart failure) ([place])    a. 2014 Echo: preserved EF, grade 1 diastolic dysfunction;  b. 11/2016 LV Gram: EF 55%.  . Diabetes mellitus without complication ([place])    no medications at this time  . GERD (gastroesophageal reflux disease)   . History of NICM (nonischemic cardiomyopathy) ([place])    a. 1998/2005 Caths - nl cors;  b. 2003 EF 15-20%-->s/p Biotronik AICD; c. 2014 Echo: EF 55%;  d. 2016 Low risk MV;  e. 11/2016 Cath: nl cors, EF 55%.  Marland Kitchen Hyperlipidemia   . Hypertension 11/24/11   ECHO-EF52%  . Sleep apnea    cpap setting at 11   . Ventricular tachycardia ([place])    a. s/p Biotronik 540 Lumax DR-T AICD, ser # [id number].    Patient Active Problem List   Diagnosis Date Noted  . ICD (implantable cardioverter-defibrillator) battery depletion 12/03/2017  . Allergic asthma, mild intermittent, uncomplicated 98/92/1194  . Chest pain 12/06/2016  . Diabetes mellitus type 2, diet-controlled ([place]) 12/06/2016  . Cough 09/27/2013  . CAD (coronary artery disease)- minor CAD at cath 2005, low  risk Nuc 7/09 09/27/2013  . Non-ischemic cardiomyopathy- last EF 35-40% by echo Nov 2012 06/05/2013  . Sustained ventricular tachycardia ([place]) 06/05/2013  . ICD (implantable cardioverter-defibrillator) in place 06/05/2013  . Essential hypertension 06/05/2013  . Obesity (BMI 30.0-34.9) 06/05/2013  . Mass of finger of left hand 10/21/2012  . Sinusitis 11/12/2011  . Family hx of colon cancer requiring screening colonoscopy 10/21/2011  . Obstructive apnea 09/14/2011  . Congestive heart failure ([place]) 09/03/2010  . Disorder of teeth and supporting structures 09/03/2010  . Seasonal and perennial allergic rhinitis 09/02/2010  . Esophageal reflux 07/31/2010  . Other and unspecified hyperlipidemia 05/07/2010  . Other abnormal glucose 05/15/2009    Past Surgical History:  Procedure Laterality Date  . CARDIAC CATHETERIZATION    . CARDIAC CATHETERIZATION N/A 12/07/2016   Procedure: Left Heart Cath and Coronary Angiography;  Surgeon: Troy Sine, MD;  Location: [facility];  Service: Cardiovascular;  Laterality: N/A;  . COLONOSCOPY WITH PROPOFOL N/A 07/13/2014   Procedure: COLONOSCOPY WITH PROPOFOL;  Surgeon: Beryle Beams, MD;  Location: [facility];  Service: Endoscopy;  Laterality: N/A;  aicd  . ICD GENERATOR CHANGEOUT N/A 12/03/2017   Procedure: ICD GENERATOR CHANGEOUT;  Surgeon: Sanda Klein, MD;  Location: [facility];  Service: Cardiovascular;  Laterality: N/A;  . INSERT / REPLACE / REMOVE PACEMAKER    . MASS EXCISION  10/21/2012   Procedure: EXCISION MASS;  Surgeon: Mcarthur Rossetti, MD;  Location: [facility];  Service: Orthopedics;  Laterality: Left;  Excision Left Ring Finger Mass  . right hand surgery      pins placed in hand due to accident         Home Medications    Prior to Admission medications   Medication Sig Start Date End Date Taking? Authorizing Provider  allopurinol (ZYLOPRIM) 300 MG tablet Take 1 tablet (300 mg total) by mouth daily. 03/16/17    Croitoru, Mihai, MD  aspirin 325 MG tablet Take 325 mg by mouth daily.    [provider]  Azelastine-Fluticasone (DYMISTA) 137-50 MCG/ACT SUSP Place 2 sprays into both nostrils at bedtime. Patient not taking: Reported on 11/29/2017 10/06/17   Baird Lyons D, MD  carvedilol (COREG) 25 MG tablet Take 2 tablets (50 mg total) by mouth 2 (two) times daily. 02/12/17   Croitoru, Mihai, MD  cyclobenzaprine (FLEXERIL) 10 MG tablet Take 1 tablet (10 mg total) by mouth 2 (two) times daily as needed for muscle spasms. 04/29/18   Fredia Sorrow, MD  furosemide (LASIX) 40 MG tablet TAKE 1 TABLET(40 MG) BY MOUTH DAILY 05/16/18   Croitoru, Mihai, MD  irbesartan (AVAPRO) 300 MG tablet Take 1 tablet (300 mg total) by mouth daily. 12/03/17 12/03/18  Croitoru, Mihai, MD  naproxen (NAPROSYN) 500 MG tablet Take 500 mg by mouth 2 (two) times daily as needed (arthritis pain).    [provider]  omeprazole (PRILOSEC) 40 MG capsule Take 1 capsule (40 mg total) by mouth daily. 06/05/18   Varney Biles, MD  potassium chloride (MICRO-K) 10 MEQ CR capsule TAKE 1 CAPSULE(10 MEQ) BY MOUTH DAILY 05/16/18   Croitoru, Mihai, MD  PRESCRIPTION MEDICATION Inhale into the lungs at bedtime. CPAP    [provider]  rosuvastatin (CRESTOR) 20 MG tablet Take 1 tablet (20 mg total) by mouth daily. 02/12/17   Croitoru, Mihai, MD  traMADol (ULTRAM) 50 MG tablet Take 1 tablet (50 mg total) by mouth every 6 (six) hours as needed. 04/29/18   Fredia Sorrow, MD    Family History Family History  Problem Relation Age of Onset  . Lymphoma Father 68    Social History Social History   Tobacco Use  . Smoking status: Former Smoker    Packs/day: 0.25    Types: Cigarettes    Last attempt to quit: 10/07/2007    Years since quitting: 10.7  . Smokeless tobacco: Never Used  Substance Use Topics  . Alcohol use: No  . Drug use: No     Allergies   Nitroglycerin   Review of Systems Review of Systems  Unable to  perform ROS: Acuity of condition     Physical Exam Updated Vital Signs BP (!) 0/0   Pulse (!) 0   Physical Exam  Constitutional: He appears well-developed.  HENT:  Head: Atraumatic.  Eyes:  Fixed and dilated  Neck: Neck supple.  Cardiovascular:  No cardiac activity  Pulmonary/Chest:  Bagged via ET tube, breath sounds equal and coarse  Abdominal: Soft.  Musculoskeletal: He exhibits no deformity.  Neurological:  Unresponsive  Skin: Skin is dry.     ED Treatments / Results  Labs (all labs ordered are listed, but only abnormal results are displayed) Labs Reviewed - No data to display  EKG None  Radiology No results found.  Procedures Procedures (including critical care time)  Medications Ordered in ED Medications - No data to display  Initial Impression / Assessment and Plan / ED Course  I have reviewed the triage vital signs and the nursing notes.  Pertinent labs & imaging results that were available during my care of the patient were reviewed by me and considered in my medical decision making (see chart for details).     Patient presented to the emergency department by EMS.  Patient collapsed at home and was found to be in cardiac arrest.  He was administered a total doses of epinephrine and shocked twice for V. fib, never regained any spontaneous circulation.  There was a prolonged downtime.  Chart was reviewed patient has a history of V. tach and cardiomyopathy.  He has been intubated in the field, placement was confirmed.  As he had not had any response to an extended ACLS attempt, he was declared dead at 1:43 AM.  Raquel Sarna unaware of who the patient's primary doctor is.  Discussed with medical examiner on call, confirm that he could have IVs, ET tube, etc. removed and sent to the morgue, medical examiner will determine PCP and who will fill out death certificate.  0400 addendum -family has supplied the patient's primary care doctor, Dr. Vista Lawman.  Attempts  to contact him have been unsuccessful thus far, will continue.  Final Clinical Impressions(s) / ED Diagnoses   Final diagnoses:  Cardiac arrest [facility])    ED Discharge Orders    None       Dannie Hattabaugh, Gwenyth Allegra, MD 2018/08/03 0222    Orpah Greek, MD 03-Aug-2018 [phone]

## 2018-07-28 NOTE — ED Triage Notes (Addendum)
Pt BIB EMS from home. Took shower and went to bed with wife. Wife told EMS that his body "jumped" twice (potential defib by ICD), then became completely unresponsive. Given total of 8 1mg  doses of epi en route. CBG 55 initally, given D10 by EMS as well. PEA throughout, save for two short episode of fine VF that EMS defib'd pt for. Linton Rump in active use upon arrival in ED. Approx 63min downtime with EMS.

## 2018-07-28 NOTE — Progress Notes (Signed)
Post CPR.  Patient did not make it.  Met family and Dr. Pollina in Consult A-huge family-very upset as expected when patient died unexpectedly.  Was with them throughout the process of hearing form the Dr., trying to accept the news of his death, going to view the body and finally preparing to go home afterwards. It was a very difficult time for the family.  Next of kin info given to the nurse  Wife of patient- Iris Leach, [address], [place], [place] Phone [phone]   07/13/2018 0300  Clinical Encounter Type  Visited With Family  Visit Type Death;ED  Referral From Nurse  Consult/Referral To Chaplain  Spiritual Encounters  Spiritual Needs Prayer;Emotional;Grief support  Stress Factors  Patient Stress Factors None identified  Family Stress Factors Loss;Other (Comment) (unexpected death)   Funeral home [employer] on [address], [place] 

## 2018-07-28 NOTE — Code Documentation (Signed)
Patient time of death occurred at 75. Called by Betsey Holiday, MD.

## 2018-07-28 NOTE — Code Documentation (Signed)
[name] paused for pulse check. Pt remains pulseless and in PEA.

## 2018-07-28 DEATH — deceased
